# Patient Record
Sex: Female | Born: 1980 | Race: White | Hispanic: No | Marital: Married | Smoking: Former smoker
Health system: Southern US, Community
[De-identification: ages and names within clinical notes are randomized; demographics above are authoritative.]

## PROBLEM LIST (undated history)

## (undated) ENCOUNTER — Inpatient Hospital Stay (HOSPITAL_COMMUNITY): Payer: Self-pay

## (undated) DIAGNOSIS — R51 Headache: Secondary | ICD-10-CM

## (undated) DIAGNOSIS — O24419 Gestational diabetes mellitus in pregnancy, unspecified control: Secondary | ICD-10-CM

## (undated) DIAGNOSIS — R12 Heartburn: Secondary | ICD-10-CM

## (undated) DIAGNOSIS — O26899 Other specified pregnancy related conditions, unspecified trimester: Secondary | ICD-10-CM

## (undated) DIAGNOSIS — R87619 Unspecified abnormal cytological findings in specimens from cervix uteri: Secondary | ICD-10-CM

## (undated) DIAGNOSIS — J45909 Unspecified asthma, uncomplicated: Secondary | ICD-10-CM

## (undated) DIAGNOSIS — B977 Papillomavirus as the cause of diseases classified elsewhere: Secondary | ICD-10-CM

## (undated) DIAGNOSIS — R339 Retention of urine, unspecified: Secondary | ICD-10-CM

## (undated) DIAGNOSIS — O34599 Maternal care for other abnormalities of gravid uterus, unspecified trimester: Secondary | ICD-10-CM

## (undated) DIAGNOSIS — O021 Missed abortion: Secondary | ICD-10-CM

## (undated) DIAGNOSIS — Z8619 Personal history of other infectious and parasitic diseases: Secondary | ICD-10-CM

## (undated) DIAGNOSIS — IMO0002 Reserved for concepts with insufficient information to code with codable children: Secondary | ICD-10-CM

## (undated) DIAGNOSIS — Z87442 Personal history of urinary calculi: Secondary | ICD-10-CM

## (undated) HISTORY — DX: Personal history of other infectious and parasitic diseases: Z86.19

## (undated) HISTORY — PX: LITHOTRIPSY: SUR834

## (undated) HISTORY — PX: WISDOM TOOTH EXTRACTION: SHX21

## (undated) HISTORY — PX: CARPAL TUNNEL RELEASE: SHX101

## (undated) HISTORY — DX: Unspecified abnormal cytological findings in specimens from cervix uteri: R87.619

## (undated) HISTORY — DX: Maternal care for other abnormalities of gravid uterus, unspecified trimester: O34.599

## (undated) HISTORY — DX: Reserved for concepts with insufficient information to code with codable children: IMO0002

## (undated) HISTORY — DX: Papillomavirus as the cause of diseases classified elsewhere: B97.7

---

## 2001-04-16 ENCOUNTER — Ambulatory Visit (HOSPITAL_BASED_OUTPATIENT_CLINIC_OR_DEPARTMENT_OTHER): Admission: RE | Admit: 2001-04-16 | Discharge: 2001-04-16 | Payer: Self-pay | Admitting: Orthopedic Surgery

## 2002-07-07 ENCOUNTER — Emergency Department (HOSPITAL_COMMUNITY): Admission: EM | Admit: 2002-07-07 | Discharge: 2002-07-07 | Payer: Self-pay | Admitting: Emergency Medicine

## 2004-10-04 ENCOUNTER — Encounter: Admission: RE | Admit: 2004-10-04 | Discharge: 2004-10-04 | Payer: Self-pay | Admitting: Obstetrics and Gynecology

## 2005-08-10 ENCOUNTER — Inpatient Hospital Stay (HOSPITAL_COMMUNITY): Admission: AD | Admit: 2005-08-10 | Discharge: 2005-08-12 | Payer: Self-pay | Admitting: Obstetrics and Gynecology

## 2006-05-06 ENCOUNTER — Emergency Department (HOSPITAL_COMMUNITY): Admission: EM | Admit: 2006-05-06 | Discharge: 2006-05-06 | Payer: Self-pay | Admitting: Family Medicine

## 2007-05-26 ENCOUNTER — Inpatient Hospital Stay (HOSPITAL_COMMUNITY): Admission: AD | Admit: 2007-05-26 | Discharge: 2007-05-28 | Payer: Self-pay | Admitting: Obstetrics and Gynecology

## 2007-06-01 ENCOUNTER — Inpatient Hospital Stay (HOSPITAL_COMMUNITY): Admission: AD | Admit: 2007-06-01 | Discharge: 2007-06-01 | Payer: Self-pay | Admitting: Obstetrics & Gynecology

## 2009-07-27 ENCOUNTER — Ambulatory Visit (HOSPITAL_COMMUNITY): Admission: RE | Admit: 2009-07-27 | Discharge: 2009-07-27 | Payer: Self-pay | Admitting: Urology

## 2010-06-19 LAB — PREGNANCY, URINE: Preg Test, Ur: NEGATIVE

## 2010-08-14 NOTE — H&P (Signed)
[name], Tiffany Clark             ACCOUNT NO.:  [id number]   MEDICAL RECORD NO.:  [id number]          PATIENT TYPE:  INP   LOCATION:  9164                          FACILITY:  [facility]   PHYSICIAN:  Lenoard Aden, M.D.DATE OF BIRTH:  30-Jul-1980   DATE OF ADMISSION:  05/26/2007  DATE OF DISCHARGE:                              HISTORY & PHYSICAL   CHIEF COMPLAINT:  Labor.   HISTORY OF PRESENT ILLNESS:  She is a 30 year old white female, G2, P1  at 40-1/7 weeks' gestation with increased frequency of contractions.   SOCIAL HISTORY:  She is a nonsmoker, nondrinker. Denies drugs.   MEDICATIONS:  1. Prenatal vitamins.  2. Zoloft.   PREGNANCY COURSE:  complicated by anxiety and depression, currently on  Zoloft.   PHYSICAL EXAMINATION:  GENERAL:  She is a well developed, well nourished  white female, no acute distress.  HEENT:  Normal.  LUNGS:  Clear.  HEART:  Regular rate and rhythm.  ABDOMEN:  Soft.  Gravid. Nontender. Estimated fetal weight __________  pounds.Marland Kitchen  PELVIC:  Cervix is 5 cm, 100% vertex and -1 station.  EXTREMITIES:  No edema.  SKIN:  Intact.   IMPRESSION:  Term intrauterine pregnancy in active labor.   PLAN:  Attempt vaginal delivery with epidural as needed,      Lenoard Aden, M.D.  Electronically Signed     RJT/MEDQ  D:  05/27/2007  T:  05/27/2007  Job:  [id number]   cc:   ?

## 2010-08-17 NOTE — Op Note (Signed)
[place]  Patient:    Tiffany Clark, Tiffany Clark Visit Number: [id number] MRN: [id number]          Service Type: DSU Location: [facility] Attending Physician:  Ronne Binning Dictated by:   Nicki Reaper, M.D. Proc. Date: 04/16/01 Admit Date:  04/16/2001                             Operative Report  PREOPERATIVE DIAGNOSIS:  Carpal tunnel syndrome, right hand.  POSTOPERATIVE DIAGNOSIS:  Carpal tunnel syndrome, right hand.  OPERATION:  Decompression right median nerve.  SURGEON:  Nicki Reaper, M.D.  ASSISTANT:  Joaquin Courts, R.N.  ANESTHESIA:  Forearm-based IV regional.  DATE OF OPERATION:  April 16, 2001  ANESTHESIOLOGIST:  Bedelia Person, M.D.  HISTORY:  The patient is a 30 year old female with a history of carpal tunnel syndrome, EMG nerve conduction is positive to the right hand.  This has not responded to conservative treatment.  PROCEDURE:  The patient was brought to the operating room where a forearm-based IV regional anesthetic was carried out without difficulty.  She was prepped and draped using Betadine scrub and solution with the right arm free.  A longitudinal incision was made in the palm and carried down through the subcutaneous tissue.  Bleeders were electrocauterized.  The palmar fascia was split, the superficial palmar arch was identified, the flexor tendon tendon to the ring finger identified to the ulnar side of the median nerve. The carpal retinaculum was incised with sharp dissection.  A right angle and Sewall retractor were placed between the skin and forearm fascia, the fascia was released for approximately 3 cm proximal to the wrist crease under direct vision.  The canal was explored and no further lesions were identified.  The wound was irrigated.  The skin was closed with interrupted 5-0 nylon sutures. A sterile compressive dressing and splint were applied.  The patient tolerated the procedure well and was taken to the  recovery room for observation in satisfactory condition.  DISPOSITION:  She is discharged home to return to [facility] in one week on Vicodin and Keflex. Dictated by:   Nicki Reaper, M.D. Attending Physician:  Ronne Binning DD:  04/16/01 TD:  04/16/01 Job: [id number]

## 2010-08-17 NOTE — Op Note (Signed)
NAMECHARLYN, Tiffany Clark             ACCOUNT NO.:  [id number]   MEDICAL RECORD NO.:  [id number]          PATIENT TYPE:  INP   LOCATION:  9167                          [facility]ACILITY:  [facility]   PHYSICIAN:  Lenoard Aden, M.D.DATE OF BIRTH:  April 29, 1980   DATE OF PROCEDURE:  08/10/2005  DATE OF DISCHARGE:                                 OPERATIVE REPORT   INDICATION FOR OPERATIVE DELIVERY:  Fetal bradycardia and maternal  exhaustion.   BRIEF OPERATIVE NOTE:  After being apprised of the risks and benefits of  vacuum assistance including cephalohematoma, scalp laceration, and  intracranial hematoma, Kiwi cup was placed.  Fetal vertex, +4, OA less than  45degrees,two pulls, Apgars 8 and 9.  Cord blood collected.  Placenta  delivered spontaneously intact.  Three-vessel cord noted.  EBL 500cc.Bulb  suction done.  No evidence of lacerations.  The mother and baby to recovery  room in good condition.      Lenoard Aden, M.D.  Electronically Signed     RJT/MEDQ  D:  08/10/2005  T:  08/12/2005  Job:  [id number]

## 2010-12-21 LAB — CBC
HCT: 33.7 — ABNORMAL LOW
Hemoglobin: 11.1 — ABNORMAL LOW
Hemoglobin: 11.8 — ABNORMAL LOW
MCV: 86
RBC: 3.72 — ABNORMAL LOW
WBC: 12.3 — ABNORMAL HIGH

## 2010-12-24 LAB — URINALYSIS, ROUTINE W REFLEX MICROSCOPIC
Nitrite: NEGATIVE
Protein, ur: NEGATIVE
Urobilinogen, UA: 0.2

## 2010-12-24 LAB — CBC
Hemoglobin: 13.4
Platelets: 499 — ABNORMAL HIGH
RBC: 4.51
RDW: 14.3
WBC: 15.7 — ABNORMAL HIGH

## 2010-12-24 LAB — URINE MICROSCOPIC-ADD ON

## 2010-12-26 ENCOUNTER — Emergency Department (HOSPITAL_COMMUNITY)
Admission: EM | Admit: 2010-12-26 | Discharge: 2010-12-26 | Disposition: A | Discharge status: Home / Self Care | Payer: 59 | Attending: Emergency Medicine | Admitting: Emergency Medicine

## 2010-12-26 DIAGNOSIS — F3289 Other specified depressive episodes: Secondary | ICD-10-CM | POA: Insufficient documentation

## 2010-12-26 DIAGNOSIS — F329 Major depressive disorder, single episode, unspecified: Secondary | ICD-10-CM | POA: Insufficient documentation

## 2010-12-26 DIAGNOSIS — R1110 Vomiting, unspecified: Secondary | ICD-10-CM | POA: Insufficient documentation

## 2010-12-26 DIAGNOSIS — R109 Unspecified abdominal pain: Secondary | ICD-10-CM | POA: Insufficient documentation

## 2010-12-26 DIAGNOSIS — Z79899 Other long term (current) drug therapy: Secondary | ICD-10-CM | POA: Insufficient documentation

## 2010-12-26 DIAGNOSIS — N23 Unspecified renal colic: Secondary | ICD-10-CM | POA: Insufficient documentation

## 2010-12-26 LAB — URINALYSIS, ROUTINE W REFLEX MICROSCOPIC
Bilirubin Urine: NEGATIVE
Glucose, UA: NEGATIVE mg/dL
Ketones, ur: NEGATIVE mg/dL
Protein, ur: NEGATIVE mg/dL
Specific Gravity, Urine: 1.019 (ref 1.005–1.030)

## 2010-12-27 LAB — URINE CULTURE
Colony Count: NO GROWTH
Culture  Setup Time: 201209261350

## 2011-09-06 LAB — OB RESULTS CONSOLE ABO/RH

## 2011-09-06 LAB — OB RESULTS CONSOLE HEPATITIS B SURFACE ANTIGEN: Hepatitis B Surface Ag: NEGATIVE

## 2011-09-06 LAB — OB RESULTS CONSOLE ANTIBODY SCREEN: Antibody Screen: NEGATIVE

## 2011-09-06 LAB — OB RESULTS CONSOLE RUBELLA ANTIBODY, IGM: Rubella: IMMUNE

## 2011-10-03 ENCOUNTER — Inpatient Hospital Stay (HOSPITAL_COMMUNITY)
Admission: AD | Admit: 2011-10-03 | Discharge: 2011-10-03 | Disposition: A | Discharge status: Home / Self Care | Payer: 59 | Source: Ambulatory Visit | Attending: Obstetrics and Gynecology | Admitting: Obstetrics and Gynecology

## 2011-10-03 ENCOUNTER — Encounter (HOSPITAL_COMMUNITY): Payer: Self-pay

## 2011-10-03 DIAGNOSIS — R339 Retention of urine, unspecified: Secondary | ICD-10-CM | POA: Diagnosis present

## 2011-10-03 DIAGNOSIS — O99891 Other specified diseases and conditions complicating pregnancy: Secondary | ICD-10-CM | POA: Insufficient documentation

## 2011-10-03 DIAGNOSIS — N814 Uterovaginal prolapse, unspecified: Secondary | ICD-10-CM

## 2011-10-03 HISTORY — DX: Unspecified asthma, uncomplicated: J45.909

## 2011-10-03 HISTORY — DX: Retention of urine, unspecified: R33.9

## 2011-10-03 HISTORY — DX: Headache: R51

## 2011-10-03 LAB — URINALYSIS, ROUTINE W REFLEX MICROSCOPIC
Leukocytes, UA: NEGATIVE
Nitrite: NEGATIVE
pH: 6 (ref 5.0–8.0)

## 2011-10-03 NOTE — MAU Provider Note (Signed)
Tiffany Clark is a 31 y.o. female at [redacted]w[redacted]d presenting for inability to void since last night and unknown tissue noted at introitus. She has a hx sig for SVD x 2 and RPL x2 which she received oral progesterone until completed 12 wks.  Unable to void with initial presentation, she was cathed for urine sample this AM and residual volume of 800 cc. Reports difficulty urinating intermittent for past 2 weeks, needed to wait for up to 30 min at times. No relief with different positions / posture while attempting urination. She reports able to urinate w/o difficulty for past 2 days, but now retention returned. Has not had this problem in the past pregnancies.   History OB History    Grav Para Term Preterm Abortions TAB SAB Ect Mult Living   5 2 2       2      Past Medical History  Diagnosis Date  . Asthma   . Headache   . Urinary retention 10/03/2011   Past Surgical History  Procedure Date  . Wisdom tooth extraction   . Carpal tunnel release     r WRIST ONLY   Family History: family history is negative for Other. Social History:  reports that she has never smoked. She has never used smokeless tobacco. She reports that she does not drink alcohol or use illicit drugs.   Prenatal Transfer Tool  Maternal Diabetes: No Genetic Screening: NT sono pending tomorrow Maternal Ultrasounds/Referrals: N/A Fetal Ultrasounds or other Referrals:  None Maternal Substance Abuse:  No Significant Maternal Medications:  None Significant Maternal Lab Results:  Nl PNlabs Other Comments:  None  Review of Systems  All other systems reviewed and are negative.   No N/V, no back or flank pain, no fever, no VB / LOF No dysuria / frequency / urgency No constipation  Dilation: Closed Effacement (%): Thick Station: +1 at rest, posterior LUS visible at introitus w/ valsalva    Blood pressure 126/77, pulse 104, temperature 98.6 F (37 C), temperature source Oral, resp. rate 16, height 5\' 4"  (1.626 m), weight  74.39 kg (164 lb), last menstrual period 07/01/2011, SpO2 100.00%. Maternal Exam:  Abdomen: Fundal height is 1 FB above SP.    Introitus: Normal vulva. Vulva is negative for condylomata.  Normal vagina.  Cervix: Cervix evaluated by sterile speculum exam.   As noted above  Fetal Exam Fetal Monitor Review: Mode: hand-held doppler probe.   Baseline rate: 167.   Fetal State Assessment: Appropriate for gestational age  Physical Exam  Constitutional: She is oriented to person, place, and time. She appears well-developed. No distress.  HENT:  Head: Normocephalic.  Cardiovascular: Normal rate.   Respiratory: Breath sounds normal.  GI: Soft. She exhibits no distension. There is no tenderness.  Genitourinary: Vagina normal. Pelvic exam was performed with patient supine. Uterus is deviated (posterior LUS noted at introitus w/ bearing down effort, at +1+2 station when supine at rest.) and enlarged (c/w 13 wks size). Cervix exhibits no motion tenderness (cervix immediately anterior behind SP, sharp angle noted at urethral neck).  Musculoskeletal: Normal range of motion. She exhibits no edema.  Neurological: She is alert and oriented to person, place, and time. She has normal reflexes.  Skin: Skin is warm and dry.  Psychiatric: She has a normal mood and affect.     Results for orders placed during the hospital encounter of 10/03/11 (from the past 24 hour(s))  URINALYSIS, ROUTINE W REFLEX MICROSCOPIC     Status: Normal  Collection Time   10/03/11  8:15 AM      Component Value Range   Color, Urine YELLOW  YELLOW   APPearance CLEAR  CLEAR   Specific Gravity, Urine 1.015  1.005 - 1.030   pH 6.0  5.0 - 8.0   Glucose, UA NEGATIVE  NEGATIVE mg/dL   Hgb urine dipstick NEGATIVE  NEGATIVE   Bilirubin Urine NEGATIVE  NEGATIVE   Ketones, ur NEGATIVE  NEGATIVE mg/dL   Protein, ur NEGATIVE  NEGATIVE mg/dL   Urobilinogen, UA 0.2  0.0 - 1.0 mg/dL   Nitrite NEGATIVE  NEGATIVE   Leukocytes, UA NEGATIVE   NEGATIVE    Assessment/Plan: G5P2002  IUP at [redacted]w[redacted]d  Grossly nl UA, no evidence of UTI  Urinary retention likely d/t uterine prolapse  Patient able to void slowly after pelvic exam and with pelvic tilt while applying gentle pressure supapubic to deflect the uterus  Plan F/U in office tomorrow, likely will need to be fitted short term w/ pessary   Instruction to void q 1-2 hours while awake today. If unable to void > 12 hours, will need rpt cath  Plan discussed w/ Dr. Billy Coast, agrees  [facility] 10/03/2011, 9:47 AM

## 2011-10-03 NOTE — MAU Note (Signed)
Pt. Unable to void. In and out cath performed. 800 cc voided. Pt tolerated procedure well.Specimen sent to lab.

## 2011-10-03 NOTE — MAU Note (Signed)
Patient states she has a history of difficulty urinating, has not urinated since 2200 last night. Has some "fleshy pink tissue" coming out of the vagina.

## 2011-10-03 NOTE — MAU Provider Note (Signed)
Pt at 13 + wks with c/o tissue at vagina. + FHT's, Dr Billy Coast is coming to see her. Pt appears comfortable, stable, is aware of plan.

## 2011-10-03 NOTE — MAU Note (Signed)
Pt states had no difficulty with voiding x1 week. Last night fell asleep at 2200, awoke at 0500 this am and had intense urge to void, and was unable to. Noted pinkish white tissue at opening of her vagina, felt with fingers, states was large. Called MD office, talked to Dr. Billy Coast, and told to come to MAU for eval. Denies burning or urgency with voiding. States does have instances of incontinence, however very random. Denies bleeding or abnormal vaginal discharge.

## 2012-03-23 ENCOUNTER — Encounter (HOSPITAL_COMMUNITY): Payer: Self-pay | Admitting: *Deleted

## 2012-03-23 ENCOUNTER — Telehealth (HOSPITAL_COMMUNITY): Payer: Self-pay | Admitting: *Deleted

## 2012-03-23 NOTE — Telephone Encounter (Signed)
Preadmission screen  

## 2012-03-31 ENCOUNTER — Other Ambulatory Visit: Payer: Self-pay | Admitting: Obstetrics and Gynecology

## 2012-04-01 NOTE — L&D Delivery Note (Signed)
Operative Delivery Note At 3:59 PM a viable and healthy female was delivered via Vaginal, Spontaneous Delivery.  Presentation: vertex; Position: Left,, Occiput,, Anterior; Station: +5.  Delivery of the head: uncomplicated and spontaneous  ,   Second maneuver: ,  McRoberts Third maneuver: ,  Delivery of posterior arm Fourth maneuver: ,  na Fifth maneuver: ,  na Sixth maneuver: ,  na  Verbal consent: obtained from family.  APGAR: 9, 9; weight  Pending. Placenta status: Intact, Spontaneous.   Cord: 3 vessels with the following complications: None, true knot noted.  Cord pH: na  Anesthesia: Epidural  Episiotomy: None Lacerations: 2nd degree Suture Repair: 2.0 vicryl rapide Est. Blood Loss (mL): 300  Mom to postpartum.  Baby to nursery-stable.  Enis Riecke J 04/03/2012, 4:16 PM

## 2012-04-03 ENCOUNTER — Encounter (HOSPITAL_COMMUNITY): Payer: Self-pay | Admitting: Anesthesiology

## 2012-04-03 ENCOUNTER — Encounter (HOSPITAL_COMMUNITY): Payer: Self-pay

## 2012-04-03 ENCOUNTER — Inpatient Hospital Stay (HOSPITAL_COMMUNITY): Payer: 59 | Admitting: Anesthesiology

## 2012-04-03 ENCOUNTER — Inpatient Hospital Stay (HOSPITAL_COMMUNITY)
Admission: RE | Admit: 2012-04-03 | Discharge: 2012-04-04 | DRG: 775 | Disposition: A | Discharge status: Home / Self Care | Payer: 59 | Source: Ambulatory Visit | Attending: Obstetrics and Gynecology | Admitting: Obstetrics and Gynecology

## 2012-04-03 VITALS — BP 119/79 | HR 70 | Temp 97.7°F | Resp 18

## 2012-04-03 DIAGNOSIS — O34599 Maternal care for other abnormalities of gravid uterus, unspecified trimester: Secondary | ICD-10-CM | POA: Diagnosis present

## 2012-04-03 DIAGNOSIS — N814 Uterovaginal prolapse, unspecified: Secondary | ICD-10-CM | POA: Diagnosis present

## 2012-04-03 DIAGNOSIS — R339 Retention of urine, unspecified: Secondary | ICD-10-CM

## 2012-04-03 LAB — CBC
HCT: 32.7 % — ABNORMAL LOW (ref 36.0–46.0)
MCV: 85.4 fL (ref 78.0–100.0)
Platelets: 281 10*3/uL (ref 150–400)
RBC: 3.83 MIL/uL — ABNORMAL LOW (ref 3.87–5.11)
RDW: 13.6 % (ref 11.5–15.5)
WBC: 8.1 10*3/uL (ref 4.0–10.5)

## 2012-04-03 MED ORDER — SODIUM BICARBONATE 8.4 % IV SOLN
INTRAVENOUS | Status: DC | PRN
  Administered 2012-04-03: 5 mL via EPIDURAL

## 2012-04-03 MED ORDER — SIMETHICONE 80 MG PO CHEW: 80.0000 mg | CHEWABLE_TABLET | ORAL | Status: DC | PRN

## 2012-04-03 MED ORDER — OXYCODONE-ACETAMINOPHEN 5-325 MG PO TABS: 1.0000 | ORAL_TABLET | ORAL | Status: DC | PRN

## 2012-04-03 MED ORDER — PRENATAL MULTIVITAMIN CH
1.0000 | ORAL_TABLET | Freq: Every day | ORAL | Status: DC
  Administered 2012-04-03 – 2012-04-04 (×2): 1 via ORAL
  Filled 2012-04-03 (×2): qty 1

## 2012-04-03 MED ORDER — CITRIC ACID-SODIUM CITRATE 334-500 MG/5ML PO SOLN: 30.0000 mL | ORAL | Status: DC | PRN

## 2012-04-03 MED ORDER — FENTANYL 2.5 MCG/ML BUPIVACAINE 1/10 % EPIDURAL INFUSION (WH - ANES)
14.0000 mL/h | INTRAMUSCULAR | Status: DC
  Filled 2012-04-03: qty 125

## 2012-04-03 MED ORDER — IBUPROFEN 600 MG PO TABS
600.0000 mg | ORAL_TABLET | Freq: Four times a day (QID) | ORAL | Status: DC
  Administered 2012-04-03 – 2012-04-04 (×5): 600 mg via ORAL
  Filled 2012-04-03 (×5): qty 1

## 2012-04-03 MED ORDER — LACTATED RINGERS IV SOLN: 500.0000 mL | Freq: Once | INTRAVENOUS | Status: DC

## 2012-04-03 MED ORDER — ONDANSETRON HCL 4 MG PO TABS: 4.0000 mg | ORAL_TABLET | ORAL | Status: DC | PRN

## 2012-04-03 MED ORDER — ALBUTEROL SULFATE HFA 108 (90 BASE) MCG/ACT IN AERS: 2.0000 | INHALATION_SPRAY | Freq: Four times a day (QID) | RESPIRATORY_TRACT | Status: DC | PRN

## 2012-04-03 MED ORDER — BUTORPHANOL TARTRATE 1 MG/ML IJ SOLN
1.0000 mg | INTRAMUSCULAR | Status: DC | PRN
  Administered 2012-04-03: 1 mg via INTRAVENOUS
  Filled 2012-04-03: qty 1

## 2012-04-03 MED ORDER — OXYTOCIN BOLUS FROM INFUSION: 500.0000 mL | INTRAVENOUS | Status: DC

## 2012-04-03 MED ORDER — OXYTOCIN 40 UNITS IN LACTATED RINGERS INFUSION - SIMPLE MED
1.0000 m[IU]/min | INTRAVENOUS | Status: DC
  Administered 2012-04-03: 2 m[IU]/min via INTRAVENOUS
  Filled 2012-04-03: qty 1000

## 2012-04-03 MED ORDER — WITCH HAZEL-GLYCERIN EX PADS: 1.0000 "application " | MEDICATED_PAD | CUTANEOUS | Status: DC | PRN

## 2012-04-03 MED ORDER — LIDOCAINE HCL (PF) 1 % IJ SOLN: 30.0000 mL | INTRAMUSCULAR | Status: DC | PRN

## 2012-04-03 MED ORDER — LIDOCAINE HCL (PF) 1 % IJ SOLN
INTRAMUSCULAR | Status: DC | PRN
  Administered 2012-04-03 (×2): 4 mL

## 2012-04-03 MED ORDER — LACTATED RINGERS IV SOLN: 500.0000 mL | INTRAVENOUS | Status: DC | PRN

## 2012-04-03 MED ORDER — FLEET ENEMA 7-19 GM/118ML RE ENEM: 1.0000 | ENEMA | RECTAL | Status: DC | PRN

## 2012-04-03 MED ORDER — DIPHENHYDRAMINE HCL 25 MG PO CAPS: 25.0000 mg | ORAL_CAPSULE | Freq: Four times a day (QID) | ORAL | Status: DC | PRN

## 2012-04-03 MED ORDER — LACTATED RINGERS IV SOLN
INTRAVENOUS | Status: DC
  Administered 2012-04-03: 07:00:00 via INTRAVENOUS

## 2012-04-03 MED ORDER — DIBUCAINE 1 % RE OINT: 1.0000 "application " | TOPICAL_OINTMENT | RECTAL | Status: DC | PRN

## 2012-04-03 MED ORDER — METHYLERGONOVINE MALEATE 0.2 MG PO TABS: 0.2000 mg | ORAL_TABLET | ORAL | Status: DC | PRN

## 2012-04-03 MED ORDER — TERBUTALINE SULFATE 1 MG/ML IJ SOLN: 0.2500 mg | Freq: Once | INTRAMUSCULAR | Status: DC | PRN

## 2012-04-03 MED ORDER — FENTANYL 2.5 MCG/ML BUPIVACAINE 1/10 % EPIDURAL INFUSION (WH - ANES)
INTRAMUSCULAR | Status: DC | PRN
  Administered 2012-04-03: 14 mL/h via EPIDURAL

## 2012-04-03 MED ORDER — SENNOSIDES-DOCUSATE SODIUM 8.6-50 MG PO TABS
2.0000 | ORAL_TABLET | Freq: Every day | ORAL | Status: DC
  Administered 2012-04-04: 2 via ORAL

## 2012-04-03 MED ORDER — ACETAMINOPHEN 325 MG PO TABS: 650.0000 mg | ORAL_TABLET | ORAL | Status: DC | PRN

## 2012-04-03 MED ORDER — ONDANSETRON HCL 4 MG/2ML IJ SOLN: 4.0000 mg | Freq: Four times a day (QID) | INTRAMUSCULAR | Status: DC | PRN

## 2012-04-03 MED ORDER — EPHEDRINE 5 MG/ML INJ: 10.0000 mg | INTRAVENOUS | Status: DC | PRN

## 2012-04-03 MED ORDER — TETANUS-DIPHTH-ACELL PERTUSSIS 5-2.5-18.5 LF-MCG/0.5 IM SUSP: 0.5000 mL | Freq: Once | INTRAMUSCULAR | Status: DC

## 2012-04-03 MED ORDER — DIPHENHYDRAMINE HCL 50 MG/ML IJ SOLN: 12.5000 mg | INTRAMUSCULAR | Status: DC | PRN

## 2012-04-03 MED ORDER — PHENYLEPHRINE 40 MCG/ML (10ML) SYRINGE FOR IV PUSH (FOR BLOOD PRESSURE SUPPORT): 80.0000 ug | PREFILLED_SYRINGE | INTRAVENOUS | Status: DC | PRN

## 2012-04-03 MED ORDER — ONDANSETRON HCL 4 MG/2ML IJ SOLN: 4.0000 mg | INTRAMUSCULAR | Status: DC | PRN

## 2012-04-03 MED ORDER — EPHEDRINE 5 MG/ML INJ
10.0000 mg | INTRAVENOUS | Status: DC | PRN
  Filled 2012-04-03: qty 4

## 2012-04-03 MED ORDER — OXYTOCIN 40 UNITS IN LACTATED RINGERS INFUSION - SIMPLE MED: 62.5000 mL/h | INTRAVENOUS | Status: DC

## 2012-04-03 MED ORDER — ZOLPIDEM TARTRATE 5 MG PO TABS: 5.0000 mg | ORAL_TABLET | Freq: Every evening | ORAL | Status: DC | PRN

## 2012-04-03 MED ORDER — BENZOCAINE-MENTHOL 20-0.5 % EX AERO
1.0000 "application " | INHALATION_SPRAY | CUTANEOUS | Status: DC | PRN
  Filled 2012-04-03: qty 56

## 2012-04-03 MED ORDER — PHENYLEPHRINE 40 MCG/ML (10ML) SYRINGE FOR IV PUSH (FOR BLOOD PRESSURE SUPPORT)
80.0000 ug | PREFILLED_SYRINGE | INTRAVENOUS | Status: DC | PRN
  Filled 2012-04-03: qty 5

## 2012-04-03 MED ORDER — METHYLERGONOVINE MALEATE 0.2 MG/ML IJ SOLN: 0.2000 mg | INTRAMUSCULAR | Status: DC | PRN

## 2012-04-03 MED ORDER — IBUPROFEN 600 MG PO TABS: 600.0000 mg | ORAL_TABLET | Freq: Four times a day (QID) | ORAL | Status: DC | PRN

## 2012-04-03 MED ORDER — LANOLIN HYDROUS EX OINT: TOPICAL_OINTMENT | CUTANEOUS | Status: DC | PRN

## 2012-04-03 NOTE — H&P (Signed)
NAMECHANDY, Tiffany Clark               ACCOUNT NO.:  [id number]  MEDICAL RECORD NO.:  [id number]  LOCATION:  9160                          FACILITY:  [facility]  PHYSICIAN:  Lenoard Aden, M.D.DATE OF BIRTH:  [birth year]  DATE OF ADMISSION:  04/03/2012 DATE OF DISCHARGE:                             HISTORY & PHYSICAL   CHIEF COMPLAINT:  Induction at 39 weeks with __________.  HISTORY OF PRESENT ILLNESS:  The patient is a 32 year old white female, G5, P2, with history of symptomatic pelvic prolapse, twin pregnancy and pelvic pain in the second trimester requiring pessary.  She has had recurrent pain again in the third trimester, presents now with favorable cervix, at 39 weeks.  Due to recurrent pain, we will proceed with induction.  ALLERGIES:  She has allergies to LATEX.  SOCIAL HISTORY:  She is a nonsmoker, nondrinker.  She denies domestic or physical violence.  OBSTETRIC HISTORY:  Remarkable for two previous vaginal deliveries and two previous spontaneous abortions.  She has a current pregnancy complicated as noted above.  She has a personal history of asthma in childhood, headaches.  FAMILY HISTORY:  She has a family history which is noncontributory.  MEDICATIONS:  Additional medications include Tylenol with Codeine p.r.n. headaches and Tums.  She has had a 8 pound 2 ounce vaginal delivery and a previous 7 pounds 12 ounce vaginal delivery.  PHYSICAL EXAMINATION:  GENERAL:  She is a well-developed, well- nourished, white female, in no acute distress. HEENT:  Normal. NECK:  Supple.  Full range of motion. LUNGS:  Clear. HEART:  Regular rhythm. ABDOMEN:  Soft, gravid, nontender.  Estimated fetal weight 7-8 pounds. Cervix is 2-3 cm, 60%, vertex, -1. EXTREMITIES:  No cords. NEUROLOGIC:  Nonfocal. SKIN:  Intact.  IMPRESSION: 1. A 39-week intrauterine pregnancy. 2. History of symptomatic pelvic prolapse during pregnancy.  PLAN:  Proceed with induction, epidural as  needed.     Lenoard Aden, M.D.     RJT/MEDQ  D:  04/03/2012  T:  04/03/2012  Job:  [id number]

## 2012-04-03 NOTE — Anesthesia Procedure Notes (Signed)
Epidural Patient location during procedure: OB Start time: 04/03/2012 2:38 PM  Staffing Anesthesiologist: Earley Grobe A. Performed by: anesthesiologist   Preanesthetic Checklist Completed: patient identified, site marked, surgical consent, pre-op evaluation, timeout performed, IV checked, risks and benefits discussed and monitors and equipment checked  Epidural Patient position: sitting Prep: site prepped and draped and DuraPrep Patient monitoring: continuous pulse ox and blood pressure Approach: midline Injection technique: LOR air  Needle:  Needle type: Tuohy  Needle gauge: 17 G Needle length: 9 cm and 9 Needle insertion depth: 5 cm cm Catheter type: closed end flexible Catheter size: 19 Gauge Catheter at skin depth: 10 cm Test dose: negative and Other  Assessment Events: blood not aspirated, injection not painful, no injection resistance, negative IV test and no paresthesia  Additional Notes Patient identified. Risks and benefits discussed including failed block, incomplete  Pain control, post dural puncture headache, nerve damage, paralysis, blood pressure Changes, nausea, vomiting, reactions to medications-both toxic and allergic and post Partum back pain. All questions were answered. Patient expressed understanding and wished to proceed. Sterile technique was used throughout procedure. Epidural site was Dressed with sterile barrier dressing. No paresthesias, signs of intravascular injection Or signs of intrathecal spread were encountered.  Patient was more comfortable after the epidural was dosed. Please see RN's note for documentation of vital signs and FHR which are stable.

## 2012-04-03 NOTE — Progress Notes (Signed)
Tiffany Clark is a 32 y.o. [id number] at [redacted]w[redacted]d by LMP admitted for induction of labor due to pelvic pain and history of symptomatic prolapse in pregnancy.  Subjective: comfortable  Objective: LMP 07/01/2011 Vs pending      FHT:  FHR: 155 bpm, variability: moderate,  accelerations:  Present,  decelerations:  Absent UC:   none SVE:    2-3/70/-1  Labs: pending  Assessment / Plan: Induction of labor due to above,  progressing well on pitocin  Labor: Progressing normally Preeclampsia:  na Fetal Wellbeing:  Category I Pain Control:  Labor support without medications I/D:  n/a Anticipated MOD:  NSVD  Kamrie Fanton J 04/03/2012, 7:01 AM

## 2012-04-03 NOTE — Anesthesia Preprocedure Evaluation (Signed)

## 2012-04-03 NOTE — Progress Notes (Signed)
Tiffany Clark is a 32 y.o. [id number] at [redacted]w[redacted]d by LMP admitted for induction of labor due to pelvic pain.  Subjective: Uncomfortable  Objective: BP 130/64  Pulse 88  Temp 97.7 F (36.5 C) (Oral)  Resp 18  LMP 07/01/2011      FHT:  FHR: 155 bpm, variability: moderate,  accelerations:  Present,  decelerations:  Absent UC:   regular, every 5 minutes SVE:   Dilation: 4 Effacement (%): 60 Station: -1 Exam by:: lee IUPC placed  Labs: Lab Results  Component Value Date   WBC 8.1 04/03/2012   HGB 11.0* 04/03/2012   HCT 32.7* 04/03/2012   MCV 85.4 04/03/2012   PLT 281 04/03/2012    Assessment / Plan: Induction of labor due to noted indications,  progressing well on pitocin  Labor: Progressing normally Preeclampsia:  na Fetal Wellbeing:  Category I Pain Control:  Labor support without medications I/D:  n/a Anticipated MOD:  NSVD  Tiffany Clark J 04/03/2012, 1:44 PM

## 2012-04-04 LAB — CBC
Platelets: 254 10*3/uL (ref 150–400)
RDW: 13.9 % (ref 11.5–15.5)
WBC: 12.6 10*3/uL — ABNORMAL HIGH (ref 4.0–10.5)

## 2012-04-04 MED ORDER — OXYCODONE-ACETAMINOPHEN 5-325 MG PO TABS: 1.0000 | ORAL_TABLET | ORAL | Status: DC | PRN

## 2012-04-04 MED ORDER — IBUPROFEN 600 MG PO TABS: 600.0000 mg | ORAL_TABLET | Freq: Four times a day (QID) | ORAL | Status: DC

## 2012-04-04 NOTE — Anesthesia Postprocedure Evaluation (Signed)
Anesthesia Post Note  Patient: Tiffany Clark  Procedure(s) Performed: * No procedures listed *  Anesthesia type: Epidural  Patient location: Mother/Baby  Post pain: Pain level controlled  Post assessment: Post-op Vital signs reviewed  Last Vitals:  Filed Vitals:   04/04/12 0620  BP: 102/66  Pulse: 88  Temp: 36.7 C  Resp: 18    Post vital signs: Reviewed  Level of consciousness:alert  Complications: No apparent anesthesia complications

## 2012-04-04 NOTE — Progress Notes (Signed)
PPD 1 SVD  S:  Reports feeling well- wants discharge today             Tolerating po/ No nausea or vomiting             Bleeding is light             Pain controlled with motrin and percocet             Up ad lib / ambulatory  Newborn breast feeding  / Circumcision planned today   O:               VS: BP 121/72  Pulse 82  Temp 97.7 F (36.5 C) (Oral)  Resp 18  SpO2 99%  LMP 07/01/2011  Breastfeeding? Unknown   LABS:  Basename 04/04/12 0524 04/03/12 0645  WBC 12.6* 8.1  HGB 11.0* 11.0*  PLT 254 281                Physical Exam:             Alert and oriented X3  Lungs: Clear and unlabored  Heart: regular rate and rhythm / no mumurs  Abdomen: soft, non-tender, non-distended              Fundus: firm, non-tender, U-1  Perineum: mild edema  Lochia: light  Extremities: 1+ pedal edema, no calf pain or tenderness    A: PPD # 1   Doing well - stable status  P:  Routine post partum orders  Discharge home after newborn circ  Marlinda Mike CNM, MSN 04/04/2012, 11:38 AM

## 2012-04-04 NOTE — Discharge Summary (Signed)
Obstetric Discharge Summary Reason for Admission: induction of labor Prenatal Procedures: none Intrapartum Procedures: spontaneous vaginal delivery Postpartum Procedures: none Complications-Operative and Postpartum: 2nd degree perineal laceration and shoulder dystocia Hemoglobin  Date Value Range Status  04/04/2012 11.0* 12.0 - 15.0 g/dL Final     HCT  Date Value Range Status  04/04/2012 33.2* 36.0 - 46.0 % Final    Physical Exam:  General: alert, cooperative and no distress Lochia: appropriate Uterine Fundus: firm Incision: healing well DVT Evaluation: No evidence of DVT seen on physical exam.  Discharge Diagnoses: Term Pregnancy-delivered  Discharge Information: Date: 04/04/2012 Activity: pelvic rest Diet: routine Medications: PNV, Ibuprofen and Percocet Condition: stable Instructions: refer to practice specific booklet Discharge to: home Follow-up Information    Follow up with Lenoard Aden, MD. Schedule an appointment as soon as possible for a visit in 6 weeks.   Contact information:   Tiffany Clark [place] [address] [phone]          Newborn Data: Live born female  Birth Weight: 8 lb 8.4 oz (3867 g) APGAR: 9, 9  Home with mother.  Tiffany Clark 04/04/2012, 11:41 AM

## 2013-07-23 ENCOUNTER — Encounter (HOSPITAL_COMMUNITY): Payer: Self-pay | Admitting: *Deleted

## 2013-07-23 ENCOUNTER — Inpatient Hospital Stay (HOSPITAL_COMMUNITY)
Admission: AD | Admit: 2013-07-23 | Discharge: 2013-07-24 | Disposition: A | Discharge status: Home / Self Care | Payer: 59 | Source: Ambulatory Visit | Attending: Obstetrics and Gynecology | Admitting: Obstetrics and Gynecology

## 2013-07-23 DIAGNOSIS — O200 Threatened abortion: Secondary | ICD-10-CM | POA: Insufficient documentation

## 2013-07-23 NOTE — MAU Note (Signed)
Thursday afternoon had some light pink spotting. At 1600 today started with bright bleeding which has continued. Bleeding is not heavy but have had 2 prior SABs so is concerned. Mild cramping

## 2013-07-24 ENCOUNTER — Encounter (HOSPITAL_COMMUNITY): Payer: Self-pay | Admitting: *Deleted

## 2013-07-24 ENCOUNTER — Inpatient Hospital Stay (HOSPITAL_COMMUNITY): Payer: 59

## 2013-07-24 DIAGNOSIS — O200 Threatened abortion: Secondary | ICD-10-CM

## 2013-07-24 HISTORY — DX: Threatened abortion: O20.0

## 2013-07-24 NOTE — Discharge Instructions (Signed)
Threatened Miscarriage   A threatened miscarriage is a pregnancy that may end - you may experience cramping and/or bleeding. Often the bleeding is the same or slightly heavier than your normal menstrual period.  You should NOT:  Have sex (intercourse).  Have an orgasm.  Use any tampons.  Exercise until all bleeding stops.  Do any heavy physical activity or work. HOME CARE   You should rest at home.  Count the number of pads you use each day.   If you pass any tissue you may try to collect it in a container. Refrigerate the container until you can take the container to your doctor.   GET HELP RIGHT AWAY IF:   You have severe or pain in your belly (abdomen), lower belly, or back.  You have a fever or chills.  Your bleeding gets very heavy with you passing large clots of blood or tissue.  You feel lightheaded, weak, dizzy, or pass out (faint).   MAKE SURE YOU:   Understand these instructions.   CALL your doctor's office prior to coming to the hospital.   Document Released: 02/29/2008 Document Revised: 06/10/2011 Document Reviewed: 04/03/2009 [facility] Patient Information 2014 [employer], [place].

## 2013-07-24 NOTE — Progress Notes (Signed)
Wiliam Ke. Bailey CNM notified of pt's admission and status. Order for OB u/s given and will see pt

## 2013-07-24 NOTE — MAU Provider Note (Signed)
  History     CSN: [id number]  Arrival date and time: 07/23/13 2316 Call to provider @ 0022 Provider here to evaluate patient @ 0100 - patient still in ultrasound     Chief Complaint  Patient presents with  . Vaginal Bleeding   HPI  Onset light spotting since 1600 - intermittent brown to red color Constant mild cramping but no pain Hx recurrent SAB - prometrium 100mg  daily / sono in office @ 6 weeks with IUP last week  Past Medical History  Diagnosis Date  . Asthma   . Headache(784.0)   . Urinary retention 10/03/2011  . H/O varicella   . Abnormal Pap smear   . 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 Other abnormalities in shape or position of gravid uterus and of neighboring structures, antepartum     prolapse during pregnancy  . Chronic kidney disease     kidney stones    Past Surgical History  Procedure Laterality Date  . Wisdom tooth extraction    . Carpal tunnel release      r WRIST ONLY    Family History  Problem Relation Age of Onset  . Adopted: Yes  . Other Neg Hx     History  Substance Use Topics  . Smoking status: Never Smoker   . Smokeless tobacco: Never Used  . Alcohol Use: No    Allergies:  Allergies  Allergen Reactions  . Aleve [Naproxen Sodium]     Heart races and sweaty palms. Feels like panic attack  . Latex Rash    Prescriptions prior to admission  Medication Sig Dispense Refill  . acetaminophen (TYLENOL) 500 MG tablet Take 1,000 mg by mouth every 6 (six) hours as needed.      . Prenatal Vit-Fe Fumarate-FA (PRENATAL MULTIVITAMIN) TABS Take 1 tablet by mouth daily.      . progesterone (PROMETRIUM) 100 MG capsule Take 100 mg by mouth daily.      Marland Kitchen. albuterol (PROVENTIL HFA) 108 (90 BASE) MCG/ACT inhaler Inhale 2 puffs into the lungs every 6 (six) hours as needed.      Marland Kitchen. ibuprofen (ADVIL,MOTRIN) 600 MG tablet Take 1 tablet (600 mg total) by mouth every 6 (six) hours.  30 tablet  0  .  oxyCODONE-acetaminophen (PERCOCET/ROXICET) 5-325 MG per tablet Take 1 tablet by mouth every 4 (four) hours as needed (moderate - severe pain).  20 tablet  0    ROS  Spotting tonight Cramping  Physical Exam   Blood pressure 122/79, pulse 110, temperature 98.3 F (36.8 C), resp. rate 20, height 5\' 3"  (1.6 m), weight 78.2 kg (172 lb 6.4 oz), last menstrual period 06/03/2013, unknown if currently breastfeeding.  A Positive per records  Physical Exam  Alert and oriented / NAD Abdomen soft and non-tender Spec exam:       vaginal vault:scant brown blood      cervix: closed with no active bleeding - scant red color to blood beneath os  MAU Course  Procedures ultrasound: C/W impending SAB versus SCH with risk of MAB  IUP with singleton with FHR 79 + SCH Measurements 6 week CRL   Assessment and Plan   IUP at 7 weeks with threatened AB  1) expectant management  2) follow-up in office Monday for repeat sono - viability  Marlinda Mikeanya Jamesmichael Shadd CNM MSN [facility]oscopy[facility]ACNM 07/24/2013, 1:01 AM

## 2013-07-24 NOTE — Progress Notes (Signed)
Wiliam Ke. Bailey CNM in to do pelvic exam and discuss test results and d/c plan. Written and verbal d/c instructions given and understanding voiced.

## 2013-10-20 LAB — OB RESULTS CONSOLE RPR: RPR: NONREACTIVE

## 2013-10-20 LAB — OB RESULTS CONSOLE ABO/RH: RH Type: POSITIVE

## 2013-10-20 LAB — OB RESULTS CONSOLE ANTIBODY SCREEN: ANTIBODY SCREEN: NEGATIVE

## 2013-10-20 LAB — OB RESULTS CONSOLE RUBELLA ANTIBODY, IGM: Rubella: IMMUNE

## 2013-10-20 LAB — OB RESULTS CONSOLE HIV ANTIBODY (ROUTINE TESTING): HIV: NONREACTIVE

## 2013-10-20 LAB — OB RESULTS CONSOLE HEPATITIS B SURFACE ANTIGEN: Hepatitis B Surface Ag: NEGATIVE

## 2014-01-31 ENCOUNTER — Encounter (HOSPITAL_COMMUNITY): Payer: Self-pay | Admitting: *Deleted

## 2014-03-05 ENCOUNTER — Telehealth: Payer: Self-pay

## 2014-03-05 NOTE — Telephone Encounter (Signed)
Patient call stating that fetus is not moving as much.  Reports kick counts is 11+ in one hour.  Reassurances given.  Patient also reporting vaginal pressure and states some issues yesterday with urination that has since cleared up.  Patient also reports cramping that was intermittent and lasted about 30 minutes yesterday, but no problems since.  Patient denies issues with vaginal discharge, constipation, and diarrhea.  Patient denies sexual intercourse in last 48 hours.  Additional reassurances given.  Patient educated on possible causes of vaginal pressure including fetal engagement, UTI, bacterial and/or yeast infection.  Patient verbalized understanding and was encouraged to report to MAU, for evaluation, if symptoms became intolerable over the weekend.  Otherwise patient advised to call office on Monday for same day problem visit if symptoms still present.  No other questions or concerns.  Patient encouraged to call back if any arise. JE, CNm

## 2014-04-01 DIAGNOSIS — O24419 Gestational diabetes mellitus in pregnancy, unspecified control: Secondary | ICD-10-CM

## 2014-04-01 HISTORY — DX: Gestational diabetes mellitus in pregnancy, unspecified control: O24.419

## 2014-04-14 ENCOUNTER — Inpatient Hospital Stay (HOSPITAL_COMMUNITY)
Admission: AD | Admit: 2014-04-14 | Discharge: 2014-04-14 | Disposition: A | Discharge status: Home / Self Care | Payer: 59 | Source: Ambulatory Visit | Attending: Obstetrics & Gynecology | Admitting: Obstetrics & Gynecology

## 2014-04-14 ENCOUNTER — Encounter (HOSPITAL_COMMUNITY): Payer: Self-pay | Admitting: *Deleted

## 2014-04-14 ENCOUNTER — Inpatient Hospital Stay (HOSPITAL_COMMUNITY): Payer: 59

## 2014-04-14 DIAGNOSIS — O24420 Gestational diabetes mellitus in childbirth, diet controlled: Secondary | ICD-10-CM | POA: Insufficient documentation

## 2014-04-14 DIAGNOSIS — O24410 Gestational diabetes mellitus in pregnancy, diet controlled: Secondary | ICD-10-CM | POA: Insufficient documentation

## 2014-04-14 DIAGNOSIS — O24419 Gestational diabetes mellitus in pregnancy, unspecified control: Secondary | ICD-10-CM

## 2014-04-14 DIAGNOSIS — Z3A37 37 weeks gestation of pregnancy: Secondary | ICD-10-CM | POA: Insufficient documentation

## 2014-04-14 DIAGNOSIS — Z87442 Personal history of urinary calculi: Secondary | ICD-10-CM | POA: Insufficient documentation

## 2014-04-14 DIAGNOSIS — O289 Unspecified abnormal findings on antenatal screening of mother: Secondary | ICD-10-CM

## 2014-04-14 DIAGNOSIS — O288 Other abnormal findings on antenatal screening of mother: Secondary | ICD-10-CM

## 2014-04-14 HISTORY — DX: Gestational diabetes mellitus in pregnancy, unspecified control: O24.419

## 2014-04-14 NOTE — MAU Provider Note (Signed)
History     CSN: [id number]  Arrival date and time: 04/14/14 1712   First Provider Initiated Contact with Patient 04/14/14 1747      Chief Complaint  Patient presents with  . non-reactive fetal tracing    HPI  Tiffany Clark is a a. 34 y.o. [id number] at [redacted]w[redacted]d who presents today from the office. She states that she had a non-reactive NST, and was told to come for further monitoring. She states that she has diet controlled GDM. She denies any abdominal pain, contractions, VB or LOF. She states that the fetus has been active.   Past Medical History  Diagnosis Date  . Asthma   . Headache(784.0)   . Urinary retention 10/03/2011  . H/O varicella   . Abnormal Pap smear   . 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 Other abnormalities in shape or position of gravid uterus and of neighboring structures, antepartum     prolapse during pregnancy  . Chronic kidney disease     kidney stones    Past Surgical History  Procedure Laterality Date  . Wisdom tooth extraction    . Carpal tunnel release      r WRIST ONLY    Family History  Problem Relation Age of Onset  . Adopted: Yes  . Other Neg Hx     History  Substance Use Topics  . Smoking status: Never Smoker   . Smokeless tobacco: Never Used  . Alcohol Use: No    Allergies:  Allergies  Allergen Reactions  . Aleve [Naproxen Sodium]     Heart races and sweaty palms. Feels like panic attack  . Latex Rash    Prescriptions prior to admission  Medication Sig Dispense Refill Last Dose  . acetaminophen (TYLENOL) 500 MG tablet Take 1,000 mg by mouth every 6 (six) hours as needed for mild pain or headache.    prn  . albuterol (PROVENTIL HFA) 108 (90 BASE) MCG/ACT inhaler Inhale 2 puffs into the lungs every 6 (six) hours as needed for wheezing or shortness of breath.    prn  . calcium carbonate (TUMS - DOSED IN MG ELEMENTAL CALCIUM) 500 MG chewable tablet Chew 2 tablets by mouth 3 (three)  times daily as needed for indigestion or heartburn.     . Prenatal Vit-Fe Fumarate-FA (PRENATAL MULTIVITAMIN) TABS Take 1 tablet by mouth daily.   Past Week at Unknown time  . ranitidine (ZANTAC) 75 MG tablet Take 75 mg by mouth 2 (two) times daily.       ROS Physical Exam   Blood pressure 123/77, pulse 117, temperature 99.4 F (37.4 C), temperature source Oral, resp. rate 18, last menstrual period 06/03/2013, unknown if currently breastfeeding.  Physical Exam  Nursing note and vitals reviewed. Constitutional: She is oriented to person, place, and time. She appears well-developed and well-nourished. No distress.  Cardiovascular: Normal rate.   Respiratory: Effort normal.  GI: Soft. There is no tenderness. There is no rebound.  Neurological: She is alert and oriented to person, place, and time.  Skin: Skin is warm and dry.  Psychiatric: She has a normal mood and affect.   FHT: 140, moderate with 15x15 accels, no decels Toco: q 6-2 mins   MAU Course  Procedures   BPP: 8/8 1947: D/W Dr. Charlotta Newton, [place] for dc home   Assessment and Plan   1. Non-reactive NST (non-stress test)   2. Gestational diabetes    NST reactive here in MAU BPP  8/8  Discharge home Labor precautions Fetal kick counts  Return to MAU as needed  Follow-up Information    Follow up with Myna HidalgoZAN, JENNIFER, M, DO.   Specialty:  Obstetrics and Gynecology   Why:  As scheduled   Contact information:   [address] [place] [id number] [phone]        Tawnya CrookHogan, Heather Donovan 04/14/2014, 5:51 PM

## 2014-04-14 NOTE — Progress Notes (Signed)
Dr. Charlotta Newtonzan called to check on pt., is waiting for BPP.  MD states she has reviewed strip, reactive.

## 2014-04-14 NOTE — Discharge Instructions (Signed)
Third Trimester of Pregnancy The third trimester is from week 29 through week 42, months 7 through 9. The third trimester is a time when the fetus is growing rapidly. At the end of the ninth month, the fetus is about 20 inches in length and weighs 6-10 pounds.  BODY CHANGES Your body goes through many changes during pregnancy. The changes vary from woman to woman.   Your weight will continue to increase. You can expect to gain 25-35 pounds (11-16 kg) by the end of the pregnancy.  You may begin to get stretch marks on your hips, abdomen, and breasts.  You may urinate more often because the fetus is moving lower into your pelvis and pressing on your bladder.  You may develop or continue to have heartburn as a result of your pregnancy.  You may develop constipation because certain hormones are causing the muscles that push waste through your intestines to slow down.  You may develop hemorrhoids or swollen, bulging veins (varicose veins).  You may have pelvic pain because of the weight gain and pregnancy hormones relaxing your joints between the bones in your pelvis. Backaches may result from overexertion of the muscles supporting your posture.  You may have changes in your hair. These can include thickening of your hair, rapid growth, and changes in texture. Some women also have hair loss during or after pregnancy, or hair that feels dry or thin. Your hair will most likely return to normal after your baby is born.  Your breasts will continue to grow and be tender. A yellow discharge may leak from your breasts called colostrum.  Your belly button may stick out.  You may feel short of breath because of your expanding uterus.  You may notice the fetus "dropping," or moving lower in your abdomen.  You may have a bloody mucus discharge. This usually occurs a few days to a week before labor begins.  Your cervix becomes thin and soft (effaced) near your due date. WHAT TO EXPECT AT YOUR PRENATAL  EXAMS  You will have prenatal exams every 2 weeks until week 36. Then, you will have weekly prenatal exams. During a routine prenatal visit:  You will be weighed to make sure you and the fetus are growing normally.  Your blood pressure is taken.  Your abdomen will be measured to track your baby's growth.  The fetal heartbeat will be listened to.  Any test results from the previous visit will be discussed.  You may have a cervical check near your due date to see if you have effaced. At around 36 weeks, your caregiver will check your cervix. At the same time, your caregiver will also perform a test on the secretions of the vaginal tissue. This test is to determine if a type of bacteria, Group B streptococcus, is present. Your caregiver will explain this further. Your caregiver may ask you:  What your birth plan is.  How you are feeling.  If you are feeling the baby move.  If you have had any abnormal symptoms, such as leaking fluid, bleeding, severe headaches, or abdominal cramping.  If you have any questions. Other tests or screenings that may be performed during your third trimester include:  Blood tests that check for low iron levels (anemia).  Fetal testing to check the health, activity level, and growth of the fetus. Testing is done if you have certain medical conditions or if there are problems during the pregnancy. FALSE LABOR You may feel small, irregular contractions that   eventually go away. These are called Braxton Hicks contractions, or false labor. Contractions may last for hours, days, or even weeks before true labor sets in. If contractions come at regular intervals, intensify, or become painful, it is best to be seen by your caregiver.  SIGNS OF LABOR   Menstrual-like cramps.  Contractions that are 5 minutes apart or less.  Contractions that start on the top of the uterus and spread down to the lower abdomen and back.  A sense of increased pelvic pressure or back  pain.  A watery or bloody mucus discharge that comes from the vagina. If you have any of these signs before the 37th week of pregnancy, call your caregiver right away. You need to go to the hospital to get checked immediately. HOME CARE INSTRUCTIONS   Avoid all smoking, herbs, alcohol, and unprescribed drugs. These chemicals affect the formation and growth of the baby.  Follow your caregiver's instructions regarding medicine use. There are medicines that are either safe or unsafe to take during pregnancy.  Exercise only as directed by your caregiver. Experiencing uterine cramps is a good sign to stop exercising.  Continue to eat regular, healthy meals.  Wear a good support bra for breast tenderness.  Do not use hot tubs, steam rooms, or saunas.  Wear your seat belt at all times when driving.  Avoid raw meat, uncooked cheese, cat litter boxes, and soil used by cats. These carry germs that can cause birth defects in the baby.  Take your prenatal vitamins.  Try taking a stool softener (if your caregiver approves) if you develop constipation. Eat more high-fiber foods, such as fresh vegetables or fruit and whole grains. Drink plenty of fluids to keep your urine clear or pale yellow.  Take warm sitz baths to soothe any pain or discomfort caused by hemorrhoids. Use hemorrhoid cream if your caregiver approves.  If you develop varicose veins, wear support hose. Elevate your feet for 15 minutes, 3-4 times a day. Limit salt in your diet.  Avoid heavy lifting, wear low heal shoes, and practice good posture.  Rest a lot with your legs elevated if you have leg cramps or low back pain.  Visit your dentist if you have not gone during your pregnancy. Use a soft toothbrush to brush your teeth and be gentle when you floss.  A sexual relationship may be continued unless your caregiver directs you otherwise.  Do not travel far distances unless it is absolutely necessary and only with the approval  of your caregiver.  Take prenatal classes to understand, practice, and ask questions about the labor and delivery.  Make a trial run to the hospital.  Pack your hospital bag.  Prepare the baby's nursery.  Continue to go to all your prenatal visits as directed by your caregiver. SEEK MEDICAL CARE IF:  You are unsure if you are in labor or if your water has broken.  You have dizziness.  You have mild pelvic cramps, pelvic pressure, or nagging pain in your abdominal area.  You have persistent nausea, vomiting, or diarrhea.  You have a bad smelling vaginal discharge.  You have pain with urination. SEEK IMMEDIATE MEDICAL CARE IF:   You have a fever.  You are leaking fluid from your vagina.  You have spotting or bleeding from your vagina.  You have severe abdominal cramping or pain.  You have rapid weight loss or gain.  You have shortness of breath with chest pain.  You notice sudden or extreme swelling   of your face, hands, ankles, feet, or legs.  You have not felt your baby move in over an hour.  You have severe headaches that do not go away with medicine.  You have vision changes. Document Released: 03/12/2001 Document Revised: 03/23/2013 Document Reviewed: 05/19/2012 [employer] Patient Information 2015 [employer]. This information is not intended to replace advice given to you by your health care provider. Make sure you discuss any questions you have with your health care provider.  

## 2014-04-14 NOTE — MAU Note (Signed)
Gest diabetic, non-reactive today with variables.

## 2014-04-15 DIAGNOSIS — O24420 Gestational diabetes mellitus in childbirth, diet controlled: Secondary | ICD-10-CM | POA: Insufficient documentation

## 2014-04-15 DIAGNOSIS — O288 Other abnormal findings on antenatal screening of mother: Secondary | ICD-10-CM | POA: Insufficient documentation

## 2014-04-15 DIAGNOSIS — O24419 Gestational diabetes mellitus in pregnancy, unspecified control: Secondary | ICD-10-CM | POA: Insufficient documentation

## 2014-04-20 ENCOUNTER — Encounter (HOSPITAL_COMMUNITY): Payer: Self-pay | Admitting: General Practice

## 2014-04-20 ENCOUNTER — Inpatient Hospital Stay (HOSPITAL_COMMUNITY)
Admission: AD | Admit: 2014-04-20 | Discharge: 2014-04-20 | Disposition: A | Discharge status: Home / Self Care | Payer: 59 | Source: Ambulatory Visit | Attending: Obstetrics & Gynecology | Admitting: Obstetrics & Gynecology

## 2014-04-20 DIAGNOSIS — O9989 Other specified diseases and conditions complicating pregnancy, childbirth and the puerperium: Secondary | ICD-10-CM | POA: Insufficient documentation

## 2014-04-20 DIAGNOSIS — R109 Unspecified abdominal pain: Secondary | ICD-10-CM | POA: Insufficient documentation

## 2014-04-20 DIAGNOSIS — N949 Unspecified condition associated with female genital organs and menstrual cycle: Secondary | ICD-10-CM | POA: Insufficient documentation

## 2014-04-20 DIAGNOSIS — Z3A38 38 weeks gestation of pregnancy: Secondary | ICD-10-CM | POA: Insufficient documentation

## 2014-04-20 LAB — URINE MICROSCOPIC-ADD ON

## 2014-04-20 LAB — URINALYSIS, ROUTINE W REFLEX MICROSCOPIC
Bilirubin Urine: NEGATIVE
GLUCOSE, UA: NEGATIVE mg/dL
KETONES UR: NEGATIVE mg/dL
LEUKOCYTES UA: NEGATIVE
Nitrite: NEGATIVE
PROTEIN: NEGATIVE mg/dL
Specific Gravity, Urine: <1.005 — ABNORMAL LOW (ref 1.005–1.030)
UROBILINOGEN UA: 0.2 mg/dL (ref 0.0–1.0)
pH: 6.5 (ref 5.0–8.0)

## 2014-04-20 LAB — CBC WITH DIFFERENTIAL/PLATELET
Basophils Absolute: 0 10*3/uL (ref 0.0–0.1)
Basophils Relative: 0 % (ref 0–1)
EOS PCT: 1 % (ref 0–5)
Eosinophils Absolute: 0.1 10*3/uL (ref 0.0–0.7)
HEMATOCRIT: 33.9 % — AB (ref 36.0–46.0)
HEMOGLOBIN: 11.4 g/dL — AB (ref 12.0–15.0)
LYMPHS ABS: 2 10*3/uL (ref 0.7–4.0)
LYMPHS PCT: 19 % (ref 12–46)
MCH: 29.4 pg (ref 26.0–34.0)
MCHC: 33.6 g/dL (ref 30.0–36.0)
MCV: 87.4 fL (ref 78.0–100.0)
MONOS PCT: 6 % (ref 3–12)
Monocytes Absolute: 0.6 10*3/uL (ref 0.1–1.0)
Neutro Abs: 7.7 10*3/uL (ref 1.7–7.7)
Neutrophils Relative %: 74 % (ref 43–77)
PLATELETS: 267 10*3/uL (ref 150–400)
RBC: 3.88 MIL/uL (ref 3.87–5.11)
RDW: 13.3 % (ref 11.5–15.5)
WBC: 10.4 10*3/uL (ref 4.0–10.5)

## 2014-04-20 LAB — PROTIME-INR
INR: 0.95 (ref 0.00–1.49)
PROTHROMBIN TIME: 12.8 s (ref 11.6–15.2)

## 2014-04-20 LAB — TYPE AND SCREEN
ABO/RH(D): A POS
ANTIBODY SCREEN: NEGATIVE

## 2014-04-20 LAB — APTT: APTT: 24 s (ref 24–37)

## 2014-04-20 LAB — ABO/RH: ABO/RH(D): A POS

## 2014-04-20 LAB — FIBRINOGEN: Fibrinogen: 544 mg/dL — ABNORMAL HIGH (ref 204–475)

## 2014-04-20 MED ORDER — OXYCODONE-ACETAMINOPHEN 5-325 MG PO TABS: 1.0000 | ORAL_TABLET | ORAL | Status: DC

## 2014-04-20 MED ORDER — OXYCODONE-ACETAMINOPHEN 5-325 MG PO TABS
1.0000 | ORAL_TABLET | ORAL | Status: DC
  Filled 2014-04-20: qty 1

## 2014-04-20 MED ORDER — NITROFURANTOIN MONOHYD MACRO 100 MG PO CAPS: 100.0000 mg | ORAL_CAPSULE | Freq: Two times a day (BID) | ORAL | Status: AC

## 2014-04-20 NOTE — Discharge Instructions (Signed)
Hematuria Hematuria is blood in your urine. It can be caused by a bladder infection, kidney infection, prostate infection, kidney stone, or cancer of your urinary tract. Infections can usually be treated with medicine, and a kidney stone usually will pass through your urine. If neither of these is the cause of your hematuria, further workup to find out the reason may be needed. It is very important that you tell your health care provider about any blood you see in your urine, even if the blood stops without treatment or happens without causing pain. Blood in your urine that happens and then stops and then happens again can be a symptom of a very serious condition. Also, pain is not a symptom in the initial stages of many urinary cancers. HOME CARE INSTRUCTIONS   Drink lots of fluid, 3-4 quarts a day. If you have been diagnosed with an infection, cranberry juice is especially recommended, in addition to large amounts of water.  Avoid caffeine, tea, and carbonated beverages because they tend to irritate the bladder.  Avoid alcohol because it may irritate the prostate.  Take all medicines as directed by your health care provider.  If you were prescribed an antibiotic medicine, finish it all even if you start to feel better.  If you have been diagnosed with a kidney stone, follow your health care provider's instructions regarding straining your urine to catch the stone.  Empty your bladder often. Avoid holding urine for long periods of time.  After a bowel movement, women should cleanse front to back. Use each tissue only once.  Empty your bladder before and after sexual intercourse if you are a female. SEEK MEDICAL CARE IF:  You develop back pain.  You have a fever.  You have a feeling of sickness in your stomach (nausea) or vomiting.  Your symptoms are not better in 3 days. Return sooner if you are getting worse. SEEK IMMEDIATE MEDICAL CARE IF:   You develop severe vomiting and are  unable to keep the medicine down.  You develop severe back or abdominal pain despite taking your medicines.  You begin passing a large amount of blood or clots in your urine.  You feel extremely weak or faint, or you pass out. MAKE SURE YOU:   Understand these instructions.  Will watch your condition.  Will get help right away if you are not doing well or get worse. Document Released: 03/18/2005 Document Revised: 08/02/2013 Document Reviewed: 11/16/2012 [facility] Patient Information 2015 [place], [place]. This information is not intended to replace advice given to you by your health care provider. Make sure you discuss any questions you have with your health care provider.   Braxton Hicks Contractions Contractions of the uterus can occur throughout pregnancy. Contractions are not always a sign that you are in labor.  WHAT ARE BRAXTON HICKS CONTRACTIONS?  Contractions that occur before labor are called Braxton Hicks contractions, or false labor. Toward the end of pregnancy (32-34 weeks), these contractions can develop more often and may become more forceful. This is not true labor because these contractions do not result in opening (dilatation) and thinning of the cervix. They are sometimes difficult to tell apart from true labor because these contractions can be forceful and people have different pain tolerances. You should not feel embarrassed if you go to the hospital with false labor. Sometimes, the only way to tell if you are in true labor is for your health care provider to look for changes in the cervix. If there are  no prenatal problems or other health problems associated with the pregnancy, it is completely safe to be sent home with false labor and await the onset of true labor. HOW CAN YOU TELL THE DIFFERENCE BETWEEN TRUE AND FALSE LABOR? False Labor  The contractions of false labor are usually shorter and not as hard as those of true labor.   The contractions are usually  irregular.   The contractions are often felt in the front of the lower abdomen and in the groin.   The contractions may go away when you walk around or change positions while lying down.   The contractions get weaker and are shorter lasting as time goes on.   The contractions do not usually become progressively stronger, regular, and closer together as with true labor.  True Labor  Contractions in true labor last 30-70 seconds, become very regular, usually become more intense, and increase in frequency.   The contractions do not go away with walking.   The discomfort is usually felt in the top of the uterus and spreads to the lower abdomen and low back.   True labor can be determined by your health care provider with an exam. This will show that the cervix is dilating and getting thinner.  WHAT TO REMEMBER  Keep up with your usual exercises and follow other instructions given by your health care provider.   Take medicines as directed by your health care provider.   Keep your regular prenatal appointments.   Eat and drink lightly if you think you are going into labor.   If Braxton Hicks contractions are making you uncomfortable:   Change your position from lying down or resting to walking, or from walking to resting.   Sit and rest in a tub of warm water.   Drink 2-3 glasses of water. Dehydration may cause these contractions.   Do slow and deep breathing several times an hour.  WHEN SHOULD I SEEK IMMEDIATE MEDICAL CARE? Seek immediate medical care if:  Your contractions become stronger, more regular, and closer together.   You have fluid leaking or gushing from your vagina.   You have a fever.   You pass blood-tinged mucus.   You have vaginal bleeding.   You have continuous abdominal pain.   You have low back pain that you never had before.   You feel your baby's head pushing down and causing pelvic pressure.   Your baby is not moving  as much as it used to.  Document Released: 03/18/2005 Document Revised: 03/23/2013 Document Reviewed: 12/28/2012 [facility] Patient Information 2015 [place]. This information is not intended to replace advice given to you by your health care provider. Make sure you discuss any questions you have with your health care provider.

## 2014-04-20 NOTE — MAU Note (Signed)
Pt sent from MD office with constant lower abd/pelvic pain, denies bleeding or LOF.  Has noted some blood in urine, reactive NST in office, cervix closed.

## 2014-04-20 NOTE — MAU Note (Signed)
Urine in lab 

## 2014-04-26 ENCOUNTER — Encounter (HOSPITAL_COMMUNITY): Payer: Self-pay

## 2014-04-26 NOTE — Patient Instructions (Addendum)
   Your procedure is scheduled on:  Friday, Jan 29  [facility] at: 11:30 AM Pick up the phone at the desk and dial [phone] and inform us of your arrival.  Please call this number if you have any problems the morning of surgery: [phone]  Remember: Do not eat food after midnight: Thursday Do not drink clear liquids after: 9 AM Friday, day of surgery Take these medicines the morning of surgery with a SIP OF WATER:  Bring albuterol inhaler with you on day of surgery.  Do not wear jewelry, make-up, or FINGER nail polish No metal in your hair or on your body. Do not wear lotions, powders, perfumes.  You may wear deodorant.  Do not bring valuables to the hospital. Contacts, dentures or bridgework may not be worn into surgery.  Leave suitcase in the car. After Surgery it may be brought to your room. For patients being admitted to the hospital, checkout time is 11:00am the day of discharge.  Home with husband Thereasa DistanceRodney cell [phone]

## 2014-04-27 ENCOUNTER — Encounter (HOSPITAL_COMMUNITY)
Admission: RE | Admit: 2014-04-27 | Discharge: 2014-04-27 | Disposition: A | Discharge status: Home / Self Care | Payer: 59 | Source: Ambulatory Visit | Attending: Obstetrics & Gynecology | Admitting: Obstetrics & Gynecology

## 2014-04-27 ENCOUNTER — Encounter (HOSPITAL_COMMUNITY): Payer: Self-pay

## 2014-04-27 HISTORY — DX: Personal history of urinary calculi: Z87.442

## 2014-04-27 HISTORY — DX: Heartburn: R12

## 2014-04-27 HISTORY — DX: Missed abortion: O02.1

## 2014-04-27 HISTORY — DX: Other specified pregnancy related conditions, unspecified trimester: O26.899

## 2014-04-27 LAB — TYPE AND SCREEN
ABO/RH(D): A POS
Antibody Screen: NEGATIVE

## 2014-04-27 LAB — CBC
HCT: 33.7 % — ABNORMAL LOW (ref 36.0–46.0)
Hemoglobin: 11.3 g/dL — ABNORMAL LOW (ref 12.0–15.0)
MCH: 29.1 pg (ref 26.0–34.0)
MCHC: 33.5 g/dL (ref 30.0–36.0)
MCV: 86.9 fL (ref 78.0–100.0)
Platelets: 271 10*3/uL (ref 150–400)
RBC: 3.88 MIL/uL (ref 3.87–5.11)
RDW: 13.5 % (ref 11.5–15.5)
WBC: 8.8 10*3/uL (ref 4.0–10.5)

## 2014-04-27 LAB — BASIC METABOLIC PANEL WITH GFR
Anion gap: 5 (ref 5–15)
BUN: 10 mg/dL (ref 6–23)
CO2: 22 mmol/L (ref 19–32)
Calcium: 8.9 mg/dL (ref 8.4–10.5)
Chloride: 108 mmol/L (ref 96–112)
Creatinine, Ser: 0.54 mg/dL (ref 0.50–1.10)
GFR calc Af Amer: >90 mL/min
GFR calc non Af Amer: >90 mL/min
Glucose, Bld: 107 mg/dL — ABNORMAL HIGH (ref 70–99)
Potassium: 3.8 mmol/L (ref 3.5–5.1)
Sodium: 135 mmol/L (ref 135–145)

## 2014-04-27 NOTE — H&P (Signed)
  HPI: 34 y/o [id number] @ 3255w3d estimated gestational age presents for scheduled C-section due to prior shoulder dystocia.   no Leaking of Fluid,   no Vaginal Bleeding,   irregular Uterine Contractions,  + Fetal Movement.  ROS: no HA, no epigastric pain, no visual changes.    Pregnancy complicated by: 1) POP s/p urinary retention and pessary, now resolved  2)h/o shoulder dystocia & VAVD-reviewed R/B of recurrence of shoulder dystocia vs primary C-section. After much discussion- regarding risk and benefits of each option, pt desires primary C-seciton.  3) GDMA1- well controlled, last growth @ 2673w5d-vertex/posterior/EFW: 3002g, 6#10oz, (67%) 4) GERD- on Omeprazole daily.    Prenatal Transfer Tool  Maternal Diabetes: Yes:  Diabetes Type:  Diet controlled Genetic Screening: Normal Maternal Ultrasounds/Referrals: Normal Fetal Ultrasounds or other Referrals:  None Maternal Substance Abuse:  No Significant Maternal Medications:  Meds include: Other:  Omeprazole Significant Maternal Lab Results: Lab values include: Group B Strep negative   PNL:  GBS negative, Rub Immune, Hep B neg, RPR NR, HIV neg, GC/C neg, glucola:elevated Hgb 11.4 (04/20/14) Blood type: A positive  Immunizations: Tdap given (03/14/14) Flu given (01/11/14)  OBHx:  SABx 3,  FTNSVD, 7#12 oz, no complications VAVD, 8#2oz FTNSVD, 8#8oz, complicated by shoulder dysoctia  PMHx:  GERD Meds:  PNV, Omeprazole prn Allergy:   Allergies  Allergen Reactions  . Aleve [Naproxen Sodium]     Heart races and sweaty palms. Feels like panic attack  . Hydrocodone Nausea And Vomiting  . Latex Rash   SurgHx: none SocHx:   no Tobacco, no  EtOH, no Illicit Drugs  O: LMP 06/03/2013 Physical examination performed in office: 04/25/14 BP: 108/68 Gen. AAOx3, NAD CV.  RRR Resp. CTAB Abd. Gravid,  no tenderness,  no rigidity,  no guarding Extr.  no edema B/L FHT: 120 by doppler Toco: irregular SVE: 1/25/-3   Labs: see  orders  A/P:  34 y.o. [id number] @ 4455w3d EGA who presents for scheduled primary C-section -FWB:  Reassuring  -CBC, T&S to be performed prior to procedures -SCDs to OR -Ancef 2g IV to OR -GDMA1- accucheck prior to surgery -Risk benefits and alternatives of cesarean section were discussed with the patient including but not limited to infection, bleeding, damage to bowel, bladder and baby with the need for further surgery. Pt voiced understanding and desires to proceed.   Myna HidalgoJennifer Moksh Loomer, DO [phone] (pager) [phone] (office)

## 2014-04-28 LAB — RPR: RPR: NONREACTIVE

## 2014-04-29 ENCOUNTER — Encounter (HOSPITAL_COMMUNITY)
Admission: AD | Disposition: A | Discharge status: Home / Self Care | Payer: Self-pay | Source: Ambulatory Visit | Attending: Obstetrics & Gynecology

## 2014-04-29 ENCOUNTER — Encounter (HOSPITAL_COMMUNITY): Payer: Self-pay | Admitting: Anesthesiology

## 2014-04-29 ENCOUNTER — Inpatient Hospital Stay (HOSPITAL_COMMUNITY)
Admission: AD | Admit: 2014-04-29 | Discharge: 2014-05-01 | DRG: 766 | Disposition: A | Discharge status: Home / Self Care | Payer: 59 | Source: Ambulatory Visit | Attending: Obstetrics & Gynecology | Admitting: Obstetrics & Gynecology

## 2014-04-29 ENCOUNTER — Inpatient Hospital Stay (HOSPITAL_COMMUNITY): Payer: 59 | Admitting: Anesthesiology

## 2014-04-29 DIAGNOSIS — O200 Threatened abortion: Secondary | ICD-10-CM

## 2014-04-29 DIAGNOSIS — O24420 Gestational diabetes mellitus in childbirth, diet controlled: Secondary | ICD-10-CM | POA: Diagnosis present

## 2014-04-29 DIAGNOSIS — Z3493 Encounter for supervision of normal pregnancy, unspecified, third trimester: Secondary | ICD-10-CM

## 2014-04-29 DIAGNOSIS — O34533 Maternal care for retroversion of gravid uterus, third trimester: Principal | ICD-10-CM | POA: Diagnosis present

## 2014-04-29 DIAGNOSIS — O24410 Gestational diabetes mellitus in pregnancy, diet controlled: Secondary | ICD-10-CM

## 2014-04-29 DIAGNOSIS — Z3A39 39 weeks gestation of pregnancy: Secondary | ICD-10-CM | POA: Diagnosis present

## 2014-04-29 DIAGNOSIS — N854 Malposition of uterus: Secondary | ICD-10-CM | POA: Diagnosis present

## 2014-04-29 DIAGNOSIS — Z87891 Personal history of nicotine dependence: Secondary | ICD-10-CM | POA: Diagnosis not present

## 2014-04-29 DIAGNOSIS — Z91040 Latex allergy status: Secondary | ICD-10-CM

## 2014-04-29 DIAGNOSIS — K219 Gastro-esophageal reflux disease without esophagitis: Secondary | ICD-10-CM | POA: Diagnosis present

## 2014-04-29 DIAGNOSIS — Z3490 Encounter for supervision of normal pregnancy, unspecified, unspecified trimester: Secondary | ICD-10-CM

## 2014-04-29 DIAGNOSIS — O9989 Other specified diseases and conditions complicating pregnancy, childbirth and the puerperium: Secondary | ICD-10-CM | POA: Diagnosis present

## 2014-04-29 DIAGNOSIS — O9962 Diseases of the digestive system complicating childbirth: Secondary | ICD-10-CM | POA: Diagnosis present

## 2014-04-29 DIAGNOSIS — O288 Other abnormal findings on antenatal screening of mother: Secondary | ICD-10-CM

## 2014-04-29 HISTORY — DX: Encounter for supervision of normal pregnancy, unspecified, unspecified trimester: Z34.90

## 2014-04-29 LAB — GLUCOSE, CAPILLARY
GLUCOSE-CAPILLARY: 71 mg/dL (ref 70–99)
GLUCOSE-CAPILLARY: 82 mg/dL (ref 70–99)

## 2014-04-29 SURGERY — Surgical Case
Anesthesia: Spinal | Site: Abdomen

## 2014-04-29 MED ORDER — SCOPOLAMINE 1 MG/3DAYS TD PT72
MEDICATED_PATCH | TRANSDERMAL | Status: AC
  Administered 2014-04-29: 1.5 mg via TRANSDERMAL
  Filled 2014-04-29: qty 1

## 2014-04-29 MED ORDER — MORPHINE SULFATE 0.5 MG/ML IJ SOLN
INTRAMUSCULAR | Status: AC
  Filled 2014-04-29: qty 10

## 2014-04-29 MED ORDER — OXYTOCIN 10 UNIT/ML IJ SOLN
40.0000 [IU] | INTRAMUSCULAR | Status: DC | PRN
  Administered 2014-04-29: 40 [IU] via INTRAVENOUS

## 2014-04-29 MED ORDER — SIMETHICONE 80 MG PO CHEW
80.0000 mg | CHEWABLE_TABLET | ORAL | Status: DC
  Administered 2014-04-29: 80 mg via ORAL
  Filled 2014-04-29 (×2): qty 1

## 2014-04-29 MED ORDER — WITCH HAZEL-GLYCERIN EX PADS: 1.0000 "application " | MEDICATED_PAD | CUTANEOUS | Status: DC | PRN

## 2014-04-29 MED ORDER — CEFAZOLIN SODIUM-DEXTROSE 2-3 GM-% IV SOLR
2.0000 g | INTRAVENOUS | Status: AC
  Administered 2014-04-29: 2 g via INTRAVENOUS

## 2014-04-29 MED ORDER — DIPHENHYDRAMINE HCL 25 MG PO CAPS: 25.0000 mg | ORAL_CAPSULE | Freq: Four times a day (QID) | ORAL | Status: DC | PRN

## 2014-04-29 MED ORDER — NALBUPHINE HCL 10 MG/ML IJ SOLN: 5.0000 mg | INTRAMUSCULAR | Status: DC | PRN

## 2014-04-29 MED ORDER — NALOXONE HCL 0.4 MG/ML IJ SOLN: 0.4000 mg | INTRAMUSCULAR | Status: DC | PRN

## 2014-04-29 MED ORDER — METOCLOPRAMIDE HCL 5 MG/ML IJ SOLN: 10.0000 mg | Freq: Once | INTRAMUSCULAR | Status: DC | PRN

## 2014-04-29 MED ORDER — SCOPOLAMINE 1 MG/3DAYS TD PT72: 1.0000 | MEDICATED_PATCH | Freq: Once | TRANSDERMAL | Status: DC

## 2014-04-29 MED ORDER — MISOPROSTOL 200 MCG PO TABS
ORAL_TABLET | ORAL | Status: AC
  Filled 2014-04-29: qty 5

## 2014-04-29 MED ORDER — SIMETHICONE 80 MG PO CHEW
80.0000 mg | CHEWABLE_TABLET | Freq: Three times a day (TID) | ORAL | Status: DC
  Administered 2014-04-29 – 2014-05-01 (×5): 80 mg via ORAL
  Filled 2014-04-29 (×5): qty 1

## 2014-04-29 MED ORDER — ONDANSETRON HCL 4 MG/2ML IJ SOLN
INTRAMUSCULAR | Status: AC
  Filled 2014-04-29: qty 2

## 2014-04-29 MED ORDER — MENTHOL 3 MG MT LOZG: 1.0000 | LOZENGE | OROMUCOSAL | Status: DC | PRN

## 2014-04-29 MED ORDER — CITRIC ACID-SODIUM CITRATE 334-500 MG/5ML PO SOLN
30.0000 mL | Freq: Once | ORAL | Status: AC
  Administered 2014-04-29: 30 mL via ORAL

## 2014-04-29 MED ORDER — NALOXONE HCL 1 MG/ML IJ SOLN
1.0000 ug/kg/h | INTRAVENOUS | Status: DC | PRN
  Filled 2014-04-29: qty 2

## 2014-04-29 MED ORDER — ALBUTEROL SULFATE (2.5 MG/3ML) 0.083% IN NEBU: 3.0000 mL | INHALATION_SOLUTION | Freq: Four times a day (QID) | RESPIRATORY_TRACT | Status: DC | PRN

## 2014-04-29 MED ORDER — MEPERIDINE HCL 25 MG/ML IJ SOLN: 6.2500 mg | INTRAMUSCULAR | Status: DC | PRN

## 2014-04-29 MED ORDER — OXYTOCIN 10 UNIT/ML IJ SOLN
INTRAMUSCULAR | Status: AC
  Filled 2014-04-29: qty 4

## 2014-04-29 MED ORDER — ONDANSETRON HCL 4 MG/2ML IJ SOLN: 4.0000 mg | INTRAMUSCULAR | Status: DC | PRN

## 2014-04-29 MED ORDER — DIBUCAINE 1 % RE OINT: 1.0000 "application " | TOPICAL_OINTMENT | RECTAL | Status: DC | PRN

## 2014-04-29 MED ORDER — PHENYLEPHRINE 8 MG IN D5W 100 ML (0.08MG/ML) PREMIX OPTIME
INJECTION | INTRAVENOUS | Status: AC
  Filled 2014-04-29: qty 100

## 2014-04-29 MED ORDER — ONDANSETRON HCL 4 MG PO TABS: 4.0000 mg | ORAL_TABLET | ORAL | Status: DC | PRN

## 2014-04-29 MED ORDER — TETANUS-DIPHTH-ACELL PERTUSSIS 5-2.5-18.5 LF-MCG/0.5 IM SUSP: 0.5000 mL | Freq: Once | INTRAMUSCULAR | Status: DC

## 2014-04-29 MED ORDER — NALBUPHINE HCL 10 MG/ML IJ SOLN: 5.0000 mg | Freq: Once | INTRAMUSCULAR | Status: AC | PRN

## 2014-04-29 MED ORDER — MORPHINE SULFATE (PF) 0.5 MG/ML IJ SOLN
INTRAMUSCULAR | Status: DC | PRN
  Administered 2014-04-29: .15 mg via INTRATHECAL

## 2014-04-29 MED ORDER — CEFAZOLIN SODIUM-DEXTROSE 2-3 GM-% IV SOLR
INTRAVENOUS | Status: AC
  Filled 2014-04-29: qty 50

## 2014-04-29 MED ORDER — LACTATED RINGERS IV SOLN
INTRAVENOUS | Status: DC
  Administered 2014-04-29 (×3): via INTRAVENOUS

## 2014-04-29 MED ORDER — BUPIVACAINE IN DEXTROSE 0.75-8.25 % IT SOLN
INTRATHECAL | Status: DC | PRN
  Administered 2014-04-29: 1.4 mL via INTRATHECAL

## 2014-04-29 MED ORDER — ONDANSETRON HCL 4 MG/2ML IJ SOLN
INTRAMUSCULAR | Status: DC | PRN
Start: 2014-04-29 — End: 2014-04-29
  Administered 2014-04-29: 4 mg via INTRAVENOUS

## 2014-04-29 MED ORDER — SODIUM CHLORIDE 0.9 % IJ SOLN: 3.0000 mL | INTRAMUSCULAR | Status: DC | PRN

## 2014-04-29 MED ORDER — FENTANYL CITRATE 0.05 MG/ML IJ SOLN
INTRAMUSCULAR | Status: AC
  Filled 2014-04-29: qty 2

## 2014-04-29 MED ORDER — OXYTOCIN 40 UNITS IN LACTATED RINGERS INFUSION - SIMPLE MED: 62.5000 mL/h | INTRAVENOUS | Status: AC

## 2014-04-29 MED ORDER — CITRIC ACID-SODIUM CITRATE 334-500 MG/5ML PO SOLN
ORAL | Status: AC
  Administered 2014-04-29: 30 mL via ORAL
  Filled 2014-04-29: qty 15

## 2014-04-29 MED ORDER — LANOLIN HYDROUS EX OINT: 1.0000 "application " | TOPICAL_OINTMENT | CUTANEOUS | Status: DC | PRN

## 2014-04-29 MED ORDER — LACTATED RINGERS IV SOLN: Freq: Once | INTRAVENOUS | Status: DC

## 2014-04-29 MED ORDER — LACTATED RINGERS IV SOLN
INTRAVENOUS | Status: DC
  Administered 2014-04-29: 21:00:00 via INTRAVENOUS

## 2014-04-29 MED ORDER — SCOPOLAMINE 1 MG/3DAYS TD PT72
1.0000 | MEDICATED_PATCH | Freq: Once | TRANSDERMAL | Status: DC
  Administered 2014-04-29: 1.5 mg via TRANSDERMAL

## 2014-04-29 MED ORDER — EPHEDRINE SULFATE 50 MG/ML IJ SOLN
INTRAMUSCULAR | Status: DC | PRN
  Administered 2014-04-29 (×2): 10 mg via INTRAVENOUS

## 2014-04-29 MED ORDER — SENNOSIDES-DOCUSATE SODIUM 8.6-50 MG PO TABS
2.0000 | ORAL_TABLET | ORAL | Status: DC
  Administered 2014-04-29: 2 via ORAL
  Filled 2014-04-29 (×2): qty 2

## 2014-04-29 MED ORDER — IBUPROFEN 600 MG PO TABS
600.0000 mg | ORAL_TABLET | Freq: Four times a day (QID) | ORAL | Status: DC
  Administered 2014-04-29 – 2014-05-01 (×8): 600 mg via ORAL
  Filled 2014-04-29 (×8): qty 1

## 2014-04-29 MED ORDER — FENTANYL CITRATE 0.05 MG/ML IJ SOLN: 25.0000 ug | INTRAMUSCULAR | Status: DC | PRN

## 2014-04-29 MED ORDER — FENTANYL CITRATE 0.05 MG/ML IJ SOLN
INTRAMUSCULAR | Status: DC | PRN
  Administered 2014-04-29: 25 ug via INTRATHECAL

## 2014-04-29 MED ORDER — ACETAMINOPHEN 325 MG PO TABS: 650.0000 mg | ORAL_TABLET | ORAL | Status: DC | PRN

## 2014-04-29 MED ORDER — DIPHENHYDRAMINE HCL 25 MG PO CAPS
25.0000 mg | ORAL_CAPSULE | ORAL | Status: DC | PRN
  Filled 2014-04-29: qty 1

## 2014-04-29 MED ORDER — ONDANSETRON HCL 4 MG/2ML IJ SOLN: 4.0000 mg | Freq: Three times a day (TID) | INTRAMUSCULAR | Status: DC | PRN

## 2014-04-29 MED ORDER — PHENYLEPHRINE 8 MG IN D5W 100 ML (0.08MG/ML) PREMIX OPTIME
INJECTION | INTRAVENOUS | Status: DC | PRN
  Administered 2014-04-29: 60 ug/min via INTRAVENOUS

## 2014-04-29 MED ORDER — SIMETHICONE 80 MG PO CHEW: 80.0000 mg | CHEWABLE_TABLET | ORAL | Status: DC | PRN

## 2014-04-29 MED ORDER — DIPHENHYDRAMINE HCL 50 MG/ML IJ SOLN: 12.5000 mg | INTRAMUSCULAR | Status: DC | PRN

## 2014-04-29 MED ORDER — SODIUM CHLORIDE 0.9 % IR SOLN
Status: DC | PRN
  Administered 2014-04-29: 1000 mL

## 2014-04-29 MED ORDER — ZOLPIDEM TARTRATE 5 MG PO TABS: 5.0000 mg | ORAL_TABLET | Freq: Every evening | ORAL | Status: DC | PRN

## 2014-04-29 MED ORDER — PRENATAL MULTIVITAMIN CH
1.0000 | ORAL_TABLET | Freq: Every day | ORAL | Status: DC
  Administered 2014-04-30 – 2014-05-01 (×2): 1 via ORAL
  Filled 2014-04-29 (×2): qty 1

## 2014-04-29 MED ORDER — EPHEDRINE 5 MG/ML INJ
INTRAVENOUS | Status: AC
Start: 2014-04-29 — End: 2014-04-29
  Filled 2014-04-29: qty 10

## 2014-04-29 MED ORDER — PANTOPRAZOLE SODIUM 20 MG PO TBEC
20.0000 mg | DELAYED_RELEASE_TABLET | Freq: Two times a day (BID) | ORAL | Status: DC
  Administered 2014-04-29 – 2014-05-01 (×3): 20 mg via ORAL
  Filled 2014-04-29 (×6): qty 1

## 2014-04-29 SURGICAL SUPPLY — 34 items
BARRIER ADHS 3X4 INTERCEED (GAUZE/BANDAGES/DRESSINGS) ×3 IMPLANT
BENZOIN TINCTURE PRP APPL 2/3 (GAUZE/BANDAGES/DRESSINGS) ×3 IMPLANT
CLAMP CORD UMBIL (MISCELLANEOUS) IMPLANT
CLOSURE WOUND 1/2 X4 (GAUZE/BANDAGES/DRESSINGS) ×1
CLOTH BEACON ORANGE TIMEOUT ST (SAFETY) ×3 IMPLANT
DRAPE SHEET LG 3/4 BI-LAMINATE (DRAPES) IMPLANT
DRSG OPSITE POSTOP 4X10 (GAUZE/BANDAGES/DRESSINGS) ×3 IMPLANT
DURAPREP 26ML APPLICATOR (WOUND CARE) ×3 IMPLANT
ELECT REM PT RETURN 9FT ADLT (ELECTROSURGICAL) ×3
ELECTRODE REM PT RTRN 9FT ADLT (ELECTROSURGICAL) ×1 IMPLANT
EXTRACTOR VACUUM KIWI (MISCELLANEOUS) IMPLANT
GLOVE BIOGEL PI IND STRL 6.5 (GLOVE) ×1 IMPLANT
GLOVE BIOGEL PI INDICATOR 6.5 (GLOVE) ×2
GLOVE ECLIPSE 6.5 STRL STRAW (GLOVE) ×3 IMPLANT
GOWN STRL REUS W/TWL LRG LVL3 (GOWN DISPOSABLE) ×6 IMPLANT
KIT ABG SYR 3ML LUER SLIP (SYRINGE) IMPLANT
LIQUID BAND (GAUZE/BANDAGES/DRESSINGS) IMPLANT
NEEDLE HYPO 25X5/8 SAFETYGLIDE (NEEDLE) IMPLANT
NS IRRIG 1000ML POUR BTL (IV SOLUTION) ×3 IMPLANT
PACK C SECTION WH (CUSTOM PROCEDURE TRAY) ×3 IMPLANT
PAD ABD 7.5X8 STRL (GAUZE/BANDAGES/DRESSINGS) ×3 IMPLANT
PAD ABD 8X7 1/2 STERILE (GAUZE/BANDAGES/DRESSINGS) ×3 IMPLANT
PAD OB MATERNITY 4.3X12.25 (PERSONAL CARE ITEMS) ×3 IMPLANT
RTRCTR C-SECT PINK 25CM LRG (MISCELLANEOUS) ×3 IMPLANT
STRIP CLOSURE SKIN 1/2X4 (GAUZE/BANDAGES/DRESSINGS) ×2 IMPLANT
SUT PLAIN 0 NONE (SUTURE) IMPLANT
SUT PLAIN 2 0 XLH (SUTURE) IMPLANT
SUT VIC AB 0 CT1 27 (SUTURE) ×4
SUT VIC AB 0 CT1 27XBRD ANBCTR (SUTURE) ×2 IMPLANT
SUT VIC AB 0 CTX 36 (SUTURE) ×6
SUT VIC AB 0 CTX36XBRD ANBCTRL (SUTURE) ×3 IMPLANT
SUT VIC AB 4-0 KS 27 (SUTURE) ×3 IMPLANT
TOWEL OR 17X24 6PK STRL BLUE (TOWEL DISPOSABLE) ×3 IMPLANT
TRAY FOLEY CATH 16FR SILVER (SET/KITS/TRAYS/PACK) ×3 IMPLANT

## 2014-04-29 NOTE — Interval H&P Note (Signed)
History and Physical Interval Note:  04/29/2014 12:41 PM  Tiffany Clark  has presented today for surgery, with the diagnosis of Z87.59 History of Shoulder Dystocia  The various methods of treatment have been discussed with the patient and family. After consideration of risks, benefits and other options for treatment, the patient has consented to  Procedure(s): CESAREAN SECTION (N/A) as a surgical intervention .  The patient's history has been reviewed, patient examined, no change in status, stable for surgery.  I have reviewed the patient's chart and labs.  Questions were answered to the patient's satisfaction.     Myna HidalgoZAN, Charizma Gardiner, M

## 2014-04-29 NOTE — Op Note (Signed)
PreOp Diagnosis: Intrauterine pregnancy @ 2812w3d, Prior shoulder dystocia, desire for primary C-section PostOp Diagnosis: same Procedure: Primary LTCS Surgeon: Dr. Myna HidalgoJennifer Jermale Crass Anesthesia: spinal Complications: none EBL: 750cc UOP: 100cc Fluids: 2300cc  Findings: Retroverted uterus, normal tubes and ovaries bilaterally, Female infant from vertex presentation, APGARs 9,9  PROCEDURE:  Informed consent was obtained from the patient with risks, benefits, complications, treatment options, and expected outcomes discussed with the patient.  The patient concurred with the proposed plan, giving informed consent with form signed.   The patient was taken to Operating Room, and identified with the procedure verified as C-Section Delivery with Time Out. With induction of anesthesia, the patient was prepped and draped in the usual sterile fashion. A Pfannenstiel incision was made and carried down through the subcutaneous tissue to the fascia. The fascia was incised in the midline and extended transversely. The superior aspect of the fascial incision was grasped with Kochers elevated and the underlying muscle dissected off. The inferior aspect of the facial incision was in similar fashion, grasped elevated and rectus muscles dissected off. The peritoneum was identified and entered. Peritoneal incision was extended longitudinally. The Alexis retractor was inserted.  The utero-vesical peritoneal reflection was identified and incised transversely with the [facility], the incision extended laterally, the bladder flap created digitally. A low transverse uterine incision was made and the infants head delivered atraumatically. After the umbilical cord was clamped and cut cord blood was obtained for evaluation.   The placenta was removed intact and appeared normal. The uterine outline, tubes and ovaries appeared normal. The uterine incision was closed with running locked sutures of 0 Vicryl and a second layer of the  same stitch was used in an imbricating fashion.  Excellent hemostasis was obtained.  The pericolic gutters were then cleared of all clots and debris. Interceed was placed over the hysterotomy.  The Alexis retractor was removed.  The peritoneum was closed with 2-0 vicryl in a running fashion. The fascia was then reapproximated with running sutures of 0 Vicryl. The skin was closed with 4-0 vicryl in a subcuticular fashion.  Instrument, sponge, and needle counts were correct prior the abdominal closure and at the conclusion of the case. The patient was taken to recovery in stable condition.  Myna HidalgoJennifer Jmari Pelc, DO [phone] (pager) [phone] (office)

## 2014-04-29 NOTE — Anesthesia Preprocedure Evaluation (Signed)
Anesthesia Evaluation  Patient identified by MRN, date of birth, ID band Patient awake    Reviewed: Allergy & Precautions, NPO status , Patient's Chart, lab work & pertinent test results  Airway Mallampati: II  TM Distance: >3 FB Neck ROM: Full    Dental no notable dental hx. (+) Teeth Intact   Pulmonary asthma , former smoker,  breath sounds clear to auscultation  Pulmonary exam normal       Cardiovascular negative cardio ROS  Rhythm:Regular Rate:Normal     Neuro/Psych  Headaches, negative psych ROS   GI/Hepatic Neg liver ROS, GERD-  Medicated and Controlled,  Endo/Other  diabetes, Well Controlled, GestationalObesity  Renal/GU negative Renal ROS     Musculoskeletal negative musculoskeletal ROS (+)   Abdominal (+) + obese,   Peds  Hematology negative hematology ROS (+)   Anesthesia Other Findings   Reproductive/Obstetrics Hx/o Shoulder dystocia                             Anesthesia Physical Anesthesia Plan  ASA: II  Anesthesia Plan: Spinal   Post-op Pain Management:    Induction:   Airway Management Planned: Natural Airway  Additional Equipment:   Intra-op Plan:   Post-operative Plan:   Informed Consent: I have reviewed the patients History and Physical, chart, labs and discussed the procedure including the risks, benefits and alternatives for the proposed anesthesia with the patient or authorized representative who has indicated his/her understanding and acceptance.     Plan Discussed with: Anesthesiologist, CRNA and Surgeon  Anesthesia Plan Comments:         Anesthesia Quick Evaluation

## 2014-04-29 NOTE — Anesthesia Procedure Notes (Signed)
Spinal Patient location during procedure: OR Start time: 04/29/2014 1:30 PM Staffing Anesthesiologist: Jeweldean Drohan A. Performed by: anesthesiologist  Preanesthetic Checklist Completed: patient identified, site marked, surgical consent, pre-op evaluation, timeout performed, IV checked, risks and benefits discussed and monitors and equipment checked Spinal Block Patient position: sitting Prep: site prepped and draped and DuraPrep Patient monitoring: heart rate, cardiac monitor, continuous pulse ox and blood pressure Approach: midline Location: L3-4 Injection technique: single-shot Needle Needle type: Sprotte  Needle gauge: 24 G Needle length: 9 cm Needle insertion depth: 5 cm Assessment Sensory level: T4 Additional Notes Patient tolerated procedure well. Adequate sensory level.

## 2014-04-29 NOTE — Anesthesia Postprocedure Evaluation (Signed)
  Anesthesia Post-op Note  Patient: Tiffany Clark  Procedure(s) Performed: Procedure(s): CESAREAN SECTION (N/A)  Patient Location: Mother/Baby  Anesthesia Type:Spinal  Level of Consciousness: awake, alert  and oriented  Airway and Oxygen Therapy: Patient Spontanous Breathing  Post-op Pain: none  Post-op Assessment: Post-op Vital signs reviewed and Patient's Cardiovascular Status Stable  Post-op Vital Signs: Reviewed and stable  Last Vitals:  Filed Vitals:   04/29/14 1630  BP: 99/51  Pulse: 66  Temp: 36.6 C  Resp: 16    Complications: No apparent anesthesia complications

## 2014-04-29 NOTE — Anesthesia Postprocedure Evaluation (Signed)
  Anesthesia Post-op Note  Patient: Tiffany Clark  Procedure(s) Performed: Procedure(s): CESAREAN SECTION (N/A)  Patient Location: PACU  Anesthesia Type:Spinal  Level of Consciousness: awake, alert  and oriented  Airway and Oxygen Therapy: Patient Spontanous Breathing  Post-op Pain: none  Post-op Assessment: Post-op Vital signs reviewed, Patient's Cardiovascular Status Stable, Respiratory Function Stable, Patent Airway, No signs of Nausea or vomiting, Pain level controlled, No headache, No backache and No residual numbness  Post-op Vital Signs: Reviewed and stable  Last Vitals:  Filed Vitals:   04/29/14 1445  BP:   Pulse:   Temp: 36.6 C  Resp:     Complications: No apparent anesthesia complications

## 2014-04-29 NOTE — Addendum Note (Signed)
Addendum  created 04/29/14 1733 by Janeece Ageeynthia W Mikayela Deats, CRNA   Modules edited: Notes Section   Notes Section:  File: [id number]

## 2014-04-29 NOTE — Transfer of Care (Signed)
Immediate Anesthesia Transfer of Care Note  Patient: Tiffany Clark  Procedure(s) Performed: Procedure(s): CESAREAN SECTION (N/A)  Patient Location: PACU  Anesthesia Type:Spinal  Level of Consciousness: awake, alert  and oriented  Airway & Oxygen Therapy: Patient Spontanous Breathing  Post-op Assessment: Report given to RN and Post -op Vital signs reviewed and stable  Post vital signs: Reviewed and stable  Last Vitals:  Filed Vitals:   04/29/14 1121  BP: 115/79  Pulse: 94  Temp: 36.6 C  Resp: 18    Complications: No apparent anesthesia complications

## 2014-04-29 NOTE — Lactation Note (Signed)
This note was copied from the chart of Tiffany Donney Dicengela Fawver. Lactation Consultation Note Initial visit at 8 hours of age.  Mom reports a good feedings and baby is now sleepy.  Baby is STS after bath and asleep.  Mom is determined to breastfeed longer than a month like with her other children.  [facility] resources given and discussed.  Encouraged to feed with early cues on demand.  Early newborn behavior discussed.  Hand expression demonstrated with colostrum visible.  Encouraged mom to work on hand expression with feedings.   Mom to call for assist as needed.    Patient Name: Tiffany Clark ZOXWR'UToday's Date: 04/29/2014 Reason for consult: Initial assessment   Maternal Data Has patient been taught Hand Expression?: Yes Does the patient have breastfeeding experience prior to this delivery?: Yes  Feeding Feeding Type: Breast Fed Length of feed: 0 min  LATCH Score/Interventions Latch: Grasps breast easily, tongue down, lips flanged, rhythmical sucking.  Audible Swallowing: A few with stimulation Intervention(s): Skin to skin  Type of Nipple: Everted at rest and after stimulation  Comfort (Breast/Nipple): Soft / non-tender     Hold (Positioning): No assistance needed to correctly position infant at breast. Intervention(s): Breastfeeding basics reviewed  LATCH Score: 9  Lactation Tools Discussed/Used     Consult Status Consult Status: Follow-up Date: 04/30/14 Follow-up type: In-patient    Beverely RisenShoptaw, Arvella MerlesJana Lynn 04/29/2014, 9:58 PM

## 2014-04-30 LAB — CBC
HEMATOCRIT: 30.7 % — AB (ref 36.0–46.0)
HEMOGLOBIN: 10.2 g/dL — AB (ref 12.0–15.0)
MCH: 29.2 pg (ref 26.0–34.0)
MCHC: 33.2 g/dL (ref 30.0–36.0)
MCV: 88 fL (ref 78.0–100.0)
PLATELETS: 244 10*3/uL (ref 150–400)
RBC: 3.49 MIL/uL — ABNORMAL LOW (ref 3.87–5.11)
RDW: 13.6 % (ref 11.5–15.5)
WBC: 10 10*3/uL (ref 4.0–10.5)

## 2014-04-30 LAB — BIRTH TISSUE RECOVERY COLLECTION (PLACENTA DONATION)

## 2014-04-30 NOTE — Lactation Note (Signed)
This note was copied from the chart of Tiffany Clark. Lactation Consultation Note  Patient Name: Tiffany Clark ZOXWR'UToday's Date: 04/30/2014 Reason for consult: Follow-up assessment Baby 27 hours of life. Mom nursing baby [facility]. Visitors in the room and mom covered with a blanket on couch. Mom states that baby nursing well every 2.5-3 hours and she states that she is hearing swallows when baby nursing. Mom states she feels her milk coming in and she denies any nipple pain. Enc mom to ask for assistance as needed.   Maternal Data    Feeding Feeding Type: Breast Fed  LATCH Score/Interventions                      Lactation Tools Discussed/Used     Consult Status Consult Status: PRN Follow-up type: In-patient    Geralynn OchsWILLIARD, Kaylub Detienne 04/30/2014, 5:47 PM

## 2014-04-30 NOTE — Progress Notes (Signed)
Subjective: Postpartum Day 1: Cesarean Delivery Patient reports tolerating PO and + flatus.    Objective: Vital signs in last 24 hours: Temp:  [97.8 F (36.6 C)-98.3 F (36.8 C)] 98.3 F (36.8 C) (01/30 0330) Pulse Rate:  [59-94] 66 (01/30 0330) Resp:  [9-24] 16 (01/30 0330) BP: (92-115)/(45-79) 106/56 mmHg (01/30 0330) SpO2:  [94 %-100 %] 96 % (01/30 0330) Weight:  [87.998 kg (194 lb)] 87.998 kg (194 lb) (01/29 1630)  Physical Exam:  General: alert and cooperative Lochia: appropriate Uterine Fundus: firm Incision: bandage clean dry and intact  DVT Evaluation: No evidence of DVT seen on physical exam.   Recent Labs  04/30/14 0600  HGB 10.2*  HCT 30.7*    Assessment/Plan: Status post Cesarean section. Doing well postoperatively.  Continue current care.  Romelo Sciandra J. 04/30/2014, 10:06 AM

## 2014-05-01 MED ORDER — OXYCODONE HCL 5 MG PO TABS: ORAL_TABLET | ORAL | Status: DC

## 2014-05-01 MED ORDER — IBUPROFEN 600 MG PO TABS: 600.0000 mg | ORAL_TABLET | Freq: Four times a day (QID) | ORAL | Status: DC | PRN

## 2014-05-01 NOTE — Discharge Summary (Signed)
Obstetric Discharge Summary Reason for Admission: cesarean section Prenatal Procedures: none Intrapartum Procedures: cesarean: low cervical, transverse Postpartum Procedures: none Complications-Operative and Postpartum: none HEMOGLOBIN  Date Value Ref Range Status  04/30/2014 10.2* 12.0 - 15.0 g/dL Final   HCT  Date Value Ref Range Status  04/30/2014 30.7* 36.0 - 46.0 % Final    Physical Exam:  General: alert and cooperative Lochia: appropriate Uterine Fundus: firm Incision: healing well DVT Evaluation: No evidence of DVT seen on physical exam.  Discharge Diagnoses: Term Pregnancy-delivered  Discharge Information: Date: 05/01/2014 Activity: pelvic rest Diet: routine Medications: PNV, Ibuprofen and oxycodone Condition: stable Instructions: refer to practice specific booklet Discharge to: home Follow-up Information    Follow up with Myna HidalgoZAN, JENNIFER, M, DO. Schedule an appointment as soon as possible for a visit in 2 weeks.   Specialty:  Obstetrics and Gynecology   Why:  incision check    Contact information:   [address] [place] [id number] [phone]       Newborn Data: Live born female  Birth Weight: 8 lb 7 oz (3827 g) APGAR: 9, 9  Home with mother.  Laquitta Dominski J. 05/01/2014, 9:36 AM

## 2014-05-01 NOTE — Lactation Note (Signed)
This note was copied from the chart of Tiffany Clark. Lactation Consultation Note  Follow up visit made prior to discharge.  Mom is currently feeding baby. Baby is cluster feeding.  She denies concerns of questions at present time.  Outpatient services encouraged prn.  Patient Name: Tiffany Donney Dicengela Mcclaran WUJWJ'XToday's Date: 05/01/2014     Maternal Data    Feeding    LATCH Score/Interventions                      Lactation Tools Discussed/Used     Consult Status      Huston FoleyMOULDEN, Jamarii Banks S 05/01/2014, 10:04 AM

## 2014-05-02 ENCOUNTER — Encounter (HOSPITAL_COMMUNITY): Payer: Self-pay | Admitting: Obstetrics & Gynecology

## 2014-05-17 ENCOUNTER — Ambulatory Visit (HOSPITAL_COMMUNITY)
Admission: RE | Admit: 2014-05-17 | Discharge: 2014-05-17 | Disposition: A | Discharge status: Home / Self Care | Payer: 59 | Source: Ambulatory Visit | Attending: Obstetrics & Gynecology | Admitting: Obstetrics & Gynecology

## 2014-05-17 NOTE — Lactation Note (Addendum)
Lactation Consult  Mother's reason for visit: difficult latch with cracked R nipple, infant has also had slow weight gain.  Visit Type: feeding assessment Appointment Notes:  Mother has had cracked sore nipples since day of discharge. Mothers left nipple is no longer cracked but is still very pink and tender. Mothers Right nipple has a crack that is healing. Mother has concerns about weight gain.mother states that infant has had continued slow weight gain but is now gaining since last week. She began supplementing with formula 1-2 ounces on Friday every 2-3 hours. Dr Nash Dimmer recommend supplementing if mother desired. Mother did take a few days over the weekend to bottle feed and allow nipples to heal. Mother began breast feeding again on /27/16 - Today         One Day Eight Hours  View All    01/29 01/31   Time:  1350 2318 0115    Weight   Weight  8 lb 7... 8 lb 4... 7 lb 1...  Weight       ##[id number]^[phone]^[id number]([phone]^        ________________________________________________________________________  Mother's Name: Tiffany Clark Type of delivery:  C-Section Breastfeeding Experience:  1st child breastfed for 6 weeks, 2nd child for 2 weeks and 3rd child for 5 days. Mother states she wants to breastfeed this child for at least 6 months Maternal Medical Conditions:  none Maternal Medications:  Prenatal vits  ________________________________________________________________________  Breastfeeding History (Post Discharge)  Frequency of breastfeeding: Tiffany Clark has been cluster feeding Duration  of feeding:  12-22 mins on each breast  Supplementation  Formula:  Volume 30-7760ml Frequency:  Every 2-3 hours Total volume per day:  120ml       Brand: Daron OfferGerber Goodstart    Method:  Bottle    Infant Intake and Output Assessment  Voids:  8-10 in 24 hrs.  Color:  Clear yellow Stools:  8-10 in 24 hrs.  Color:  Yellow  ________________________________________________________________________  Maternal Breast Assessment  Breast:  Full Nipple:  Erect Pain level:  1 Pain interventions:  Bra  _______________________________________________________________________ Feeding Assessment/Evaluation : infant has a small  mouth. She also has a slight high palate. Observed that she could have a posterior tongue tie and a upper lip tie. Reviewed tips for deeper latch  Mother was assisted with better latch techniques which were very helpful . Mother denied having only a pain scale of a #1 on the initial latch which soon when away. Mother advised to continue to follow plan of deeper  latch technique and continue to allow infant to feed frequently to drain breast well. Advised to get all purpose nipple ointment. Suggested follow up and consideration of frenotomy if nipples continue to be cracked and do not heal.   Initial feeding assessment:  Infant's oral assessment:  Slight high palate with slight possible posterior tie. Infant cups her tongue. She mouth is small.     Positioning:  Football   Right breast  LATCH documentation:  Latch:  2 = Grasps breast easily, tongue down, lips flanged, rhythmical sucking.  Audible swallowing:  2 = Spontaneous and intermittent  Type of nipple:  2 = Everted at rest and after stimulation  Comfort (Breast/Nipple):  0 = Engorged, cracked, bleeding, large blisters, severe discomfort  Hold (Positioning):  1 = Assistance needed to correctly position infant at breast and maintain latch  LATCH score:  7  Attached assessment:  Deep    Lips flanged:  Yes.    Lips untucked:  No.  Suck assessment:  Nutritive    Pre-feed weight:  3956 g  (8 lb. 11.5 oz.) Post-feed weight: 4032  g (8lb. 14.2 oz.) Amount transferred:  76 ml   Total amount transferred:  76 ml  Advised to phone OB provider for Rx for all purpose nipple cream Mother to pay close attention to proper latch and use good breast support Suggested to unlatch infant with increases pain Advised to continue to supplement as needed Recommend follow up for weight check to Peds provider in one week Advised to follow up with Lactation services if continue to have painful latch Discussed having provider evaluate for tight  frenula

## 2015-07-21 ENCOUNTER — Other Ambulatory Visit (HOSPITAL_COMMUNITY)
Admission: RE | Admit: 2015-07-21 | Discharge: 2015-07-21 | Disposition: A | Discharge status: Home / Self Care | Payer: 59 | Source: Ambulatory Visit | Attending: Obstetrics & Gynecology | Admitting: Obstetrics & Gynecology

## 2015-07-21 ENCOUNTER — Other Ambulatory Visit: Payer: Self-pay | Admitting: Obstetrics & Gynecology

## 2015-07-21 DIAGNOSIS — Z1151 Encounter for screening for human papillomavirus (HPV): Secondary | ICD-10-CM | POA: Insufficient documentation

## 2015-07-21 DIAGNOSIS — Z01411 Encounter for gynecological examination (general) (routine) with abnormal findings: Secondary | ICD-10-CM | POA: Diagnosis present

## 2015-07-21 DIAGNOSIS — R87810 Cervical high risk human papillomavirus (HPV) DNA test positive: Secondary | ICD-10-CM | POA: Insufficient documentation

## 2015-07-24 LAB — CYTOLOGY - PAP

## 2019-12-10 DIAGNOSIS — R87810 Cervical high risk human papillomavirus (HPV) DNA test positive: Secondary | ICD-10-CM | POA: Diagnosis not present

## 2019-12-10 DIAGNOSIS — L309 Dermatitis, unspecified: Secondary | ICD-10-CM | POA: Diagnosis not present

## 2019-12-10 DIAGNOSIS — T8332XA Displacement of intrauterine contraceptive device, initial encounter: Secondary | ICD-10-CM | POA: Diagnosis not present

## 2019-12-10 DIAGNOSIS — Z01411 Encounter for gynecological examination (general) (routine) with abnormal findings: Secondary | ICD-10-CM | POA: Diagnosis not present

## 2019-12-14 DIAGNOSIS — T8332XA Displacement of intrauterine contraceptive device, initial encounter: Secondary | ICD-10-CM | POA: Diagnosis not present

## 2019-12-14 DIAGNOSIS — Z30431 Encounter for routine checking of intrauterine contraceptive device: Secondary | ICD-10-CM | POA: Diagnosis not present

## 2020-01-11 ENCOUNTER — Other Ambulatory Visit: Payer: Self-pay

## 2020-01-11 ENCOUNTER — Encounter: Payer: Self-pay | Admitting: Internal Medicine

## 2020-01-11 ENCOUNTER — Other Ambulatory Visit (HOSPITAL_COMMUNITY)
Admission: RE | Admit: 2020-01-11 | Discharge: 2020-01-11 | Disposition: A | Discharge status: Home / Self Care | Payer: 59 | Source: Ambulatory Visit | Attending: Internal Medicine | Admitting: Internal Medicine

## 2020-01-11 ENCOUNTER — Ambulatory Visit (INDEPENDENT_AMBULATORY_CARE_PROVIDER_SITE_OTHER): Payer: 59 | Admitting: Internal Medicine

## 2020-01-11 VITALS — BP 114/71 | HR 68 | Temp 97.0°F | Resp 18 | Ht 64.0 in | Wt 174.0 lb

## 2020-01-11 DIAGNOSIS — Z7689 Persons encountering health services in other specified circumstances: Secondary | ICD-10-CM | POA: Insufficient documentation

## 2020-01-11 DIAGNOSIS — Z131 Encounter for screening for diabetes mellitus: Secondary | ICD-10-CM

## 2020-01-11 DIAGNOSIS — E669 Obesity, unspecified: Secondary | ICD-10-CM | POA: Insufficient documentation

## 2020-01-11 DIAGNOSIS — E663 Overweight: Secondary | ICD-10-CM | POA: Diagnosis not present

## 2020-01-11 DIAGNOSIS — Z87442 Personal history of urinary calculi: Secondary | ICD-10-CM | POA: Diagnosis not present

## 2020-01-11 DIAGNOSIS — Z8632 Personal history of gestational diabetes: Secondary | ICD-10-CM

## 2020-01-11 LAB — POCT GLYCOSYLATED HEMOGLOBIN (HGB A1C)
HbA1c POC (<> result, manual entry): 5.4 % (ref 4.0–5.6)
HbA1c, POC (controlled diabetic range): 5.4 % (ref 0.0–7.0)
HbA1c, POC (prediabetic range): 5.4 % — AB (ref 5.7–6.4)
Hemoglobin A1C: 5.4 % (ref 4.0–5.6)

## 2020-01-11 NOTE — Assessment & Plan Note (Signed)
H/o gestational diabetes HbA1C: 5.4 in the office today Advised for low carbohydrate diet to decrease risk of developing diabetes

## 2020-01-11 NOTE — Assessment & Plan Note (Signed)
Diet modification and moderate exercise

## 2020-01-11 NOTE — Progress Notes (Signed)
New Patient Office Visit  Subjective:  Patient ID: JAMONICA SCHOFF, female    DOB: 14-Mar-1981  Age: 39 y.o. MRN: [id number]  CC:  Chief Complaint  Patient presents with  . New Patient (Initial Visit)    new pt hasnt had pcp in awhile just establishing care     HPI RHAELYN GIRON is a 39 year old female with past medical history of nephrolithiasis and gestational diabetes presents for establishing care.  Patient has not had PCP follow-up for long time.  Patient has been in good health overall and denies any active complaints currently.  She takes multivitamin supplements every day.  She denies any headache, dizziness, neck pain, dyspnea, palpitations, nausea, vomiting, abdominal pain, dysuria, hematuria or LE swelling.  Patient has a history of nephrolithiasis, s/p lithotripsy once and other episodes treated conservatively.  She has intermittent flank pain, which resolves on its own.  She attributes the pain to possible small kidney stones.  She tries to keep adequate fluid intake.  Patient had last Pap smear in 12/2019, which was negative for intraepithelial lesion or malignancy and was negative for HPV.  She follows up with OB/GYN for annual Pap smear as she had positive HPV in the past, last positive result in 2020.  Patient is up-to-date with vaccinations including Covid vaccinations and flu vaccination.  Past Medical History:  Diagnosis Date  . Abnormal Pap smear   . Asthma    rarely uses inhaler - last use in 2007  . Gestational diabetes 2016   with 2016 preg, diet controlled, no meds  . H/O varicella   . Headache(784.0)    otc med prn  . Heartburn in pregnancy   . History of kidney stones    passed stone - no surgery required  . 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 Missed ab    x 3 - no surgery required   . Other abnormalities in shape or position of gravid uterus and of neighboring structures, antepartum    prolapse during  pregnancy  . SVD (spontaneous vaginal delivery)    x 3  . Urinary retention 10/03/2011    Past Surgical History:  Procedure Laterality Date  . CARPAL TUNNEL RELEASE     Right  WRIST ONLY  . CESAREAN SECTION N/A 04/29/2014   Procedure: CESAREAN SECTION;  Surgeon: Sharon Seller, DO;  Location: [facility];  Service: Obstetrics;  Laterality: N/A;  . CESAREAN SECTION N/A    Phreesia 01/10/2020  . LITHOTRIPSY     x 1  . WISDOM TOOTH EXTRACTION      Family History  Adopted: Yes  Problem Relation Age of Onset  . Other Neg Hx     Social History   Socioeconomic History  . Marital status: Married    Spouse name: Not on file  . Number of children: 4  . Years of education: Not on file  . Highest education level: Not on file  Occupational History    Employer: [employer]    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  Tobacco Use  . Smoking status: Former Smoker    Packs/day: 0.75    Years: 6.00    Pack years: 4.50    Types: Cigarettes    Quit date: 10/30/2004    Years since quitting: 15.2  . Smokeless tobacco: Never Used  Substance and Sexual Activity  . Alcohol use: No  . Drug use: No  . Sexual activity: Yes    Birth control/protection: None  Comment: pregnant  Other Topics Concern  . Not on file  Social History Narrative  . Not on file   Social Determinants of Health   Financial Resource Strain:   . Difficulty of Paying Living Expenses: Not on file  Food Insecurity:   . Worried About Programme researcher, broadcasting/film/video in the Last Year: Not on file  . Ran Out of Food in the Last Year: Not on file  Transportation Needs:   . Lack of Transportation (Medical): Not on file  . Lack of Transportation (Non-Medical): Not on file  Physical Activity:   . Days of Exercise per Week: Not on file  . Minutes of Exercise per Session: Not on file  Stress:   . Feeling of Stress : Not on file  Social Connections:   . Frequency of Communication with Friends and Family: Not on file  . Frequency of Social Gatherings with  Friends and Family: Not on file  . Attends Religious Services: Not on file  . Active Member of Clubs or Organizations: Not on file  . Attends Banker Meetings: Not on file  . Marital Status: Not on file  Intimate Partner Violence:   . Fear of Current or Ex-Partner: Not on file  . Emotionally Abused: Not on file  . Physically Abused: Not on file  . Sexually Abused: Not on file    ROS Review of Systems  Constitutional: Negative for chills and fever.  HENT: Negative for congestion, rhinorrhea, sinus pressure, sinus pain and sore throat.   Eyes: Negative for pain and discharge.  Respiratory: Negative for cough and shortness of breath.   Cardiovascular: Negative for chest pain and palpitations.  Gastrointestinal: Negative for abdominal pain, constipation, diarrhea, nausea and vomiting.  Endocrine: Negative for polydipsia and polyuria.  Genitourinary: Negative for dysuria and hematuria.  Musculoskeletal: Negative for neck pain and neck stiffness.  Skin: Negative for rash.  Neurological: Negative for dizziness and weakness.  Psychiatric/Behavioral: Negative for agitation and behavioral problems.    Objective:   Today's Vitals: BP 114/71 (BP Location: Right Arm, Patient Position: Sitting, Cuff Size: Normal)   Pulse 68   Temp (!) 97 F (36.1 C) (Temporal)   Resp 18   Ht 5\' 4"  (1.626 m)   Wt 174 lb (78.9 kg)   SpO2 99%   BMI 29.87 kg/m   Physical Exam Vitals reviewed.  Constitutional:      General: She is not in acute distress.    Appearance: She is not diaphoretic.  HENT:     Head: Normocephalic and atraumatic.     Nose: Nose normal.     Mouth/Throat:     Mouth: Mucous membranes are moist.  Eyes:     General: No scleral icterus.    Extraocular Movements: Extraocular movements intact.     Pupils: Pupils are equal, round, and reactive to light.  Cardiovascular:     Rate and Rhythm: Normal rate and regular rhythm.     Pulses: Normal pulses.     Heart sounds:  No murmur heard.   Pulmonary:     Breath sounds: Normal breath sounds. No wheezing or rales.  Abdominal:     Palpations: Abdomen is soft.     Tenderness: There is no abdominal tenderness.  Musculoskeletal:     Cervical back: Neck supple. No tenderness.     Right lower leg: No edema.     Left lower leg: No edema.  Skin:    General: Skin is warm.  Findings: No rash.  Neurological:     General: No focal deficit present.     Mental Status: She is alert and oriented to person, place, and time.     Sensory: No sensory deficit.     Motor: No weakness.  Psychiatric:        Mood and Affect: Mood normal.        Behavior: Behavior normal.      Assessment & Plan:   Problem List Items Addressed This Visit     Encounter to establish care Care established Previous chart reviewed History and medications reviewed with the patient  History of nephrolithiasis H/o multiple episodes of nephrolithiasis, s/p lithotripsy once Occasional flank pain, intermittent Advised to maintain adequate water intake and low-salt diet  Gestational diabetes H/o gestational diabetes HbA1C: 5.4 in the office today Advised for low carbohydrate diet to decrease risk of developing diabetes  Overweight (BMI 25.0-29.9) Diet modification and moderate exercise  Will check CBC, CMP and lipid profile.  Outpatient Encounter Medications as of 01/11/2020  Medication Sig  . [DISCONTINUED] albuterol (PROVENTIL HFA) 108 (90 BASE) MCG/ACT inhaler Inhale 2 puffs into the lungs every 6 (six) hours as needed for wheezing or shortness of breath.  (Patient not taking: Reported on 01/11/2020)  . [DISCONTINUED] calcium carbonate (TUMS - DOSED IN MG ELEMENTAL CALCIUM) 500 MG chewable tablet Chew 2 tablets by mouth 3 (three) times daily as needed for indigestion or heartburn. (Patient not taking: Reported on 01/11/2020)  . [DISCONTINUED] ibuprofen (ADVIL,MOTRIN) 600 MG tablet Take 1 tablet (600 mg total) by mouth every 6 (six)  hours as needed. (Patient not taking: Reported on 01/11/2020)  . [DISCONTINUED] oxyCODONE (ROXICODONE) 5 MG immediate release tablet 1-2 po every 6 hours prn severe pain (Patient not taking: Reported on 01/11/2020)  . [DISCONTINUED] Prenatal Vit-Fe Fumarate-FA (PRENATAL MULTIVITAMIN) TABS Take 1 tablet by mouth at bedtime.  (Patient not taking: Reported on 01/11/2020)  . [DISCONTINUED] ranitidine (ZANTAC) 75 MG tablet Take 75 mg by mouth 2 (two) times daily. (Patient not taking: Reported on 01/11/2020)   No facility-administered encounter medications on file as of 01/11/2020.    Follow-up: Return in about 6 months (around 07/11/2020).   Anabel Halon, MD

## 2020-01-11 NOTE — Assessment & Plan Note (Signed)
H/o multiple episodes of nephrolithiasis, s/p lithotripsy once Occasional flank pain, intermittent Advised to maintain adequate water intake and low-salt diet

## 2020-01-11 NOTE — Assessment & Plan Note (Signed)
Care established Previous chart reviewed History and medications reviewed with the patient 

## 2020-01-11 NOTE — Patient Instructions (Signed)
Please continue to follow healthy diet, consisting of green vegetables and fruits.  Please maintain at least 2 l of fluid/water intake per day.  Try to perform moderate exercise/walking, at least 150 mins/week.  Please get the fasting blood tests done.

## 2020-01-12 LAB — MICROALBUMIN, URINE: Microalb, Ur: <3 ug/mL — ABNORMAL HIGH

## 2020-01-14 DIAGNOSIS — Z131 Encounter for screening for diabetes mellitus: Secondary | ICD-10-CM | POA: Diagnosis not present

## 2020-01-14 DIAGNOSIS — Z7689 Persons encountering health services in other specified circumstances: Secondary | ICD-10-CM | POA: Diagnosis not present

## 2020-01-15 LAB — LIPID PANEL
Chol/HDL Ratio: 4 ratio (ref 0.0–4.4)
Cholesterol, Total: 165 mg/dL (ref 100–199)
HDL: 41 mg/dL (ref >39)
LDL Chol Calc (NIH): 107 mg/dL — ABNORMAL HIGH (ref 0–99)
Triglycerides: 89 mg/dL (ref 0–149)
VLDL Cholesterol Cal: 17 mg/dL (ref 5–40)

## 2020-01-15 LAB — CBC WITH DIFFERENTIAL/PLATELET
Basophils Absolute: 0.1 10*3/uL (ref 0.0–0.2)
Basos: 1 %
EOS (ABSOLUTE): 0.1 10*3/uL (ref 0.0–0.4)
Eos: 2 %
Hematocrit: 41.7 % (ref 34.0–46.6)
Hemoglobin: 13.8 g/dL (ref 11.1–15.9)
Immature Grans (Abs): 0 10*3/uL (ref 0.0–0.1)
Immature Granulocytes: 0 %
Lymphocytes Absolute: 2.4 10*3/uL (ref 0.7–3.1)
Lymphs: 32 %
MCH: 30.3 pg (ref 26.6–33.0)
MCHC: 33.1 g/dL (ref 31.5–35.7)
MCV: 92 fL (ref 79–97)
Monocytes Absolute: 0.6 10*3/uL (ref 0.1–0.9)
Monocytes: 8 %
Neutrophils Absolute: 4.3 10*3/uL (ref 1.4–7.0)
Neutrophils: 57 %
Platelets: 361 10*3/uL (ref 150–450)
RBC: 4.55 x10E6/uL (ref 3.77–5.28)
RDW: 12.4 % (ref 11.7–15.4)
WBC: 7.6 10*3/uL (ref 3.4–10.8)

## 2020-01-15 LAB — URINALYSIS, ROUTINE W REFLEX MICROSCOPIC
Bilirubin, UA: NEGATIVE
Glucose, UA: NEGATIVE
Ketones, UA: NEGATIVE
Nitrite, UA: NEGATIVE
Protein,UA: NEGATIVE
RBC, UA: NEGATIVE
Specific Gravity, UA: 1.007 (ref 1.005–1.030)
Urobilinogen, Ur: 0.2 mg/dL (ref 0.2–1.0)
pH, UA: 7 (ref 5.0–7.5)

## 2020-01-15 LAB — CMP14+EGFR
ALT: 14 IU/L (ref 0–32)
AST: 15 IU/L (ref 0–40)
Albumin/Globulin Ratio: 1.6 (ref 1.2–2.2)
Albumin: 4.5 g/dL (ref 3.8–4.8)
Alkaline Phosphatase: 81 IU/L (ref 44–121)
BUN/Creatinine Ratio: 14 (ref 9–23)
BUN: 11 mg/dL (ref 6–20)
Bilirubin Total: 0.7 mg/dL (ref 0.0–1.2)
CO2: 21 mmol/L (ref 20–29)
Calcium: 9.5 mg/dL (ref 8.7–10.2)
Chloride: 100 mmol/L (ref 96–106)
Creatinine, Ser: 0.76 mg/dL (ref 0.57–1.00)
GFR calc Af Amer: 114 mL/min/{1.73_m2} (ref >59)
GFR calc non Af Amer: 99 mL/min/{1.73_m2} (ref >59)
Globulin, Total: 2.9 g/dL (ref 1.5–4.5)
Glucose: 87 mg/dL (ref 65–99)
Potassium: 4.2 mmol/L (ref 3.5–5.2)
Sodium: 135 mmol/L (ref 134–144)
Total Protein: 7.4 g/dL (ref 6.0–8.5)

## 2020-01-15 LAB — MICROSCOPIC EXAMINATION
Bacteria, UA: NONE SEEN
Casts: NONE SEEN /lpf
Epithelial Cells (non renal): NONE SEEN /hpf (ref 0–10)
RBC: NONE SEEN /hpf (ref 0–2)
WBC, UA: NONE SEEN /hpf (ref 0–5)

## 2020-01-15 LAB — T4 AND TSH
T4, Total: 6.9 ug/dL (ref 4.5–12.0)
TSH: 1.89 u[IU]/mL (ref 0.450–4.500)

## 2020-01-15 LAB — HEPATITIS C ANTIBODY: Hep C Virus Ab: <0.1 s/co ratio (ref 0.0–0.9)

## 2020-03-14 LAB — HM DIABETES EYE EXAM

## 2020-07-11 ENCOUNTER — Ambulatory Visit: Payer: 59 | Admitting: Internal Medicine

## 2020-07-13 ENCOUNTER — Ambulatory Visit (INDEPENDENT_AMBULATORY_CARE_PROVIDER_SITE_OTHER): Payer: 59 | Admitting: Internal Medicine

## 2020-07-13 ENCOUNTER — Encounter: Payer: Self-pay | Admitting: Internal Medicine

## 2020-07-13 ENCOUNTER — Other Ambulatory Visit: Payer: Self-pay

## 2020-07-13 VITALS — BP 113/74 | HR 79 | Temp 98.7°F | Resp 18 | Ht 64.0 in | Wt 184.4 lb

## 2020-07-13 DIAGNOSIS — O34539 Maternal care for retroversion of gravid uterus, unspecified trimester: Secondary | ICD-10-CM | POA: Insufficient documentation

## 2020-07-13 DIAGNOSIS — Z87442 Personal history of urinary calculi: Secondary | ICD-10-CM

## 2020-07-13 DIAGNOSIS — N814 Uterovaginal prolapse, unspecified: Secondary | ICD-10-CM | POA: Insufficient documentation

## 2020-07-13 DIAGNOSIS — N941 Unspecified dyspareunia: Secondary | ICD-10-CM | POA: Insufficient documentation

## 2020-07-13 DIAGNOSIS — R87810 Cervical high risk human papillomavirus (HPV) DNA test positive: Secondary | ICD-10-CM | POA: Insufficient documentation

## 2020-07-13 DIAGNOSIS — N761 Subacute and chronic vaginitis: Secondary | ICD-10-CM | POA: Insufficient documentation

## 2020-07-13 DIAGNOSIS — N393 Stress incontinence (female) (male): Secondary | ICD-10-CM | POA: Insufficient documentation

## 2020-07-13 DIAGNOSIS — E782 Mixed hyperlipidemia: Secondary | ICD-10-CM

## 2020-07-13 DIAGNOSIS — K219 Gastro-esophageal reflux disease without esophagitis: Secondary | ICD-10-CM | POA: Insufficient documentation

## 2020-07-13 NOTE — Assessment & Plan Note (Signed)
DASH diet advised Check lipid profile

## 2020-07-13 NOTE — Assessment & Plan Note (Signed)
H/o multiple episodes of nephrolithiasis, s/p lithotripsy once Advised to maintain adequate water intake and low-salt diet

## 2020-07-13 NOTE — Progress Notes (Signed)
Established Patient Office Visit  Subjective:  Patient ID: Tiffany Clark, female    DOB: 15-Nov-1980  Age: 40 y.o. MRN: [id number]  CC:  Chief Complaint  Patient presents with  . Follow-up    6 month follow up     HPI Tiffany Clark is a 40 year old female with past medical history of nephrolithiasis and gestational diabetes presents for follow up of her medical conditions.  She has been doing well overall.  She denies any flank pain currently. No reports of dysuria or hematuria.  Blood tests were reviewed and discussed with the patient.  Past Medical History:  Diagnosis Date  . Abnormal Pap smear   . Asthma    rarely uses inhaler - last use in 2007  . Gestational diabetes 2016   with 2016 preg, diet controlled, no meds  . H/O varicella   . Headache(784.0)    otc med prn  . Heartburn in pregnancy   . History of kidney stones    passed stone - no surgery required  . 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 Intrauterine normal pregnancy 04/29/2014  . Missed ab    x 3 - no surgery required   . Other abnormalities in shape or position of gravid uterus and of neighboring structures, antepartum    prolapse during pregnancy  . SVD (spontaneous vaginal delivery)    x 3  . Threatened abortion in first trimester 07/24/2013  . Urinary retention 10/03/2011    Past Surgical History:  Procedure Laterality Date  . CARPAL TUNNEL RELEASE     Right  WRIST ONLY  . CESAREAN SECTION N/A 04/29/2014   Procedure: CESAREAN SECTION;  Surgeon: Sharon Seller, DO;  Location: [facility];  Service: Obstetrics;  Laterality: N/A;  . CESAREAN SECTION N/A    Phreesia 01/10/2020  . LITHOTRIPSY     x 1  . WISDOM TOOTH EXTRACTION      Family History  Adopted: Yes  Problem Relation Age of Onset  . Other Neg Hx     Social History   Socioeconomic History  . Marital status: Married    Spouse name: Not on file  . Number of children: 4  . Years of  education: Not on file  . Highest education level: Not on file  Occupational History    Employer: [employer]    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  Tobacco Use  . Smoking status: Former Smoker    Packs/day: 0.75    Years: 6.00    Pack years: 4.50    Types: Cigarettes    Quit date: 10/30/2004    Years since quitting: 15.7  . Smokeless tobacco: Never Used  Substance and Sexual Activity  . Alcohol use: No  . Drug use: No  . Sexual activity: Yes    Birth control/protection: None    Comment: pregnant  Other Topics Concern  . Not on file  Social History Narrative  . Not on file   Social Determinants of Health   Financial Resource Strain: Not on file  Food Insecurity: Not on file  Transportation Needs: Not on file  Physical Activity: Not on file  Stress: Not on file  Social Connections: Not on file  Intimate Partner Violence: Not on file    Outpatient Medications Prior to Visit  Medication Sig Dispense Refill  . levonorgestrel (MIRENA, 52 MG,) 20 MCG/24HR IUD      No facility-administered medications prior to visit.    Allergies  Allergen Reactions  . Aleve [Naproxen Sodium]     Heart races and sweaty palms. Feels like panic attack. Can take Ibuprofen  . Hydrocodone Nausea And Vomiting  . Naproxen     Other reaction(s): Unknown  . Latex Rash    Other reaction(s): Unknown    ROS Review of Systems  Constitutional: Negative for chills and fever.  Respiratory: Negative for cough and shortness of breath.   Cardiovascular: Negative for chest pain and palpitations.  Genitourinary: Negative for dysuria and hematuria.  Musculoskeletal: Negative for neck pain and neck stiffness.  Skin: Negative for rash.      Objective:    Physical Exam Vitals reviewed.  Constitutional:      General: She is not in acute distress.    Appearance: She is not diaphoretic.  HENT:     Head: Normocephalic and atraumatic.  Eyes:     General: No scleral icterus.    Extraocular Movements:  Extraocular movements intact.  Cardiovascular:     Rate and Rhythm: Normal rate and regular rhythm.     Pulses: Normal pulses.     Heart sounds: Normal heart sounds. No murmur heard.   Pulmonary:     Breath sounds: Normal breath sounds. No wheezing or rales.  Musculoskeletal:     Cervical back: Neck supple. No tenderness.  Skin:    General: Skin is warm.     Findings: No rash.  Neurological:     General: No focal deficit present.     Mental Status: She is alert and oriented to person, place, and time.  Psychiatric:        Mood and Affect: Mood normal.        Behavior: Behavior normal.     BP 113/74 (BP Location: Right Arm, Patient Position: Sitting, Cuff Size: Normal)   Pulse 79   Temp 98.7 F (37.1 C) (Oral)   Resp 18   Ht 5\' 4"  (1.626 m)   Wt 184 lb 6.4 oz (83.6 kg)   SpO2 97%   BMI 31.65 kg/m  Wt Readings from Last 3 Encounters:  07/13/20 184 lb 6.4 oz (83.6 kg)  01/11/20 174 lb (78.9 kg)  04/29/14 194 lb (88 kg)     Health Maintenance Due  Topic Date Due  . HEMOGLOBIN A1C  07/11/2020    There are no preventive care reminders to display for this patient.  Lab Results  Component Value Date   TSH 1.890 01/14/2020   Lab Results  Component Value Date   WBC 7.6 01/14/2020   HGB 13.8 01/14/2020   HCT 41.7 01/14/2020   MCV 92 01/14/2020   PLT 361 01/14/2020   Lab Results  Component Value Date   NA 135 01/14/2020   K 4.2 01/14/2020   CO2 21 01/14/2020   GLUCOSE 87 01/14/2020   BUN 11 01/14/2020   CREATININE 0.76 01/14/2020   BILITOT 0.7 01/14/2020   ALKPHOS 81 01/14/2020   AST 15 01/14/2020   ALT 14 01/14/2020   PROT 7.4 01/14/2020   ALBUMIN 4.5 01/14/2020   CALCIUM 9.5 01/14/2020   ANIONGAP 5 04/27/2014   Lab Results  Component Value Date   CHOL 165 01/14/2020   Lab Results  Component Value Date   HDL 41 01/14/2020   Lab Results  Component Value Date   LDLCALC 107 (H) 01/14/2020   Lab Results  Component Value Date   TRIG 89  01/14/2020   Lab Results  Component Value Date   CHOLHDL 4.0 01/14/2020  Lab Results  Component Value Date   HGBA1C 5.4 01/11/2020   HGBA1C 5.4 01/11/2020   HGBA1C 5.4 (A) 01/11/2020   HGBA1C 5.4 01/11/2020      Assessment & Plan:   Problem List Items Addressed This Visit      Other   History of nephrolithiasis    H/o multiple episodes of nephrolithiasis, s/p lithotripsy once Advised to maintain adequate water intake and low-salt diet      Mixed hyperlipidemia - Primary    DASH diet advised Check lipid profile      Relevant Orders   Lipid Profile      No orders of the defined types were placed in this encounter.   Follow-up: Return in about 1 year (around 07/13/2021).    Anabel Halon, MD

## 2020-07-13 NOTE — Patient Instructions (Addendum)
Please continue to follow low cholesterol diet and perform moderate exercise/walking at least 150 mins/week.   [email or web address]">  DASH Eating Plan DASH stands for Dietary Approaches to Stop Hypertension. The DASH eating plan is a healthy eating plan that has been shown to:  Reduce high blood pressure (hypertension).  Reduce your risk for type 2 diabetes, heart disease, and stroke.  Help with weight loss. What are tips for following this plan? Reading food labels  Check food labels for the amount of salt (sodium) per serving. Choose foods with less than 5 percent of the Daily Value of sodium. Generally, foods with less than 300 milligrams (mg) of sodium per serving fit into this eating plan.  To find whole grains, look for the word "whole" as the first word in the ingredient list. Shopping  Buy products labeled as "low-sodium" or "no salt added."  Buy fresh foods. Avoid canned foods and pre-made or frozen meals. Cooking  Avoid adding salt when cooking. Use salt-free seasonings or herbs instead of table salt or sea salt. Check with your health care provider or pharmacist before using salt substitutes.  Do not fry foods. Cook foods using healthy methods such as baking, boiling, grilling, roasting, and broiling instead.  Cook with heart-healthy oils, such as olive, canola, avocado, soybean, or sunflower oil. Meal planning  Eat a balanced diet that includes: ? 4 or more servings of fruits and 4 or more servings of vegetables each day. Try to fill one-half of your plate with fruits and vegetables. ? 6-8 servings of whole grains each day. ? Less than 6 oz (170 g) of lean meat, poultry, or fish each day. A 3-oz (85-g) serving of meat is about the same size as a deck of cards. One egg equals 1 oz (28 g). ? 2-3 servings of low-fat dairy each day. One serving is 1 cup (237 mL). ? 1 serving of nuts, seeds, or beans 5 times each week. ? 2-3  servings of heart-healthy fats. Healthy fats called omega-3 fatty acids are found in foods such as walnuts, flaxseeds, fortified milks, and eggs. These fats are also found in cold-water fish, such as sardines, salmon, and mackerel.  Limit how much you eat of: ? Canned or prepackaged foods. ? Food that is high in trans fat, such as some fried foods. ? Food that is high in saturated fat, such as fatty meat. ? Desserts and other sweets, sugary drinks, and other foods with added sugar. ? Full-fat dairy products.  Do not salt foods before eating.  Do not eat more than 4 egg yolks a week.  Try to eat at least 2 vegetarian meals a week.  Eat more home-cooked food and less restaurant, buffet, and fast food.   Lifestyle  When eating at a restaurant, ask that your food be prepared with less salt or no salt, if possible.  If you drink alcohol: ? Limit how much you use to:  0-1 drink a day for women who are not pregnant.  0-2 drinks a day for men. ? Be aware of how much alcohol is in your drink. In the [place] one drink equals one 12 oz bottle of beer (355 mL), one 5 oz glass of wine (148 mL), or one 1 oz glass of hard liquor (44 mL). General information  Avoid eating more than 2,300 mg of salt a day. If you have hypertension, you may need to reduce your sodium intake to 1,500 mg a day.  Work with your health care provider  to maintain a healthy body weight or to lose weight. Ask what an ideal weight is for you.  Get at least 30 minutes of exercise that causes your heart to beat faster (aerobic exercise) most days of the week. Activities may include walking, swimming, or biking.  Work with your health care provider or dietitian to adjust your eating plan to your individual calorie needs. What foods should I eat? Fruits All fresh, dried, or frozen fruit. Canned fruit in natural juice (without added sugar). Vegetables Fresh or frozen vegetables (raw, steamed, roasted, or grilled). Low-sodium  or reduced-sodium tomato and vegetable juice. Low-sodium or reduced-sodium tomato sauce and tomato paste. Low-sodium or reduced-sodium canned vegetables. Grains Whole-grain or whole-wheat bread. Whole-grain or whole-wheat pasta. Brown rice. Orpah Cobb. Bulgur. Whole-grain and low-sodium cereals. Pita bread. Low-fat, low-sodium crackers. Whole-wheat flour tortillas. Meats and other proteins Skinless chicken or [place]. Ground chicken or [place]. Pork with fat trimmed off. Fish and seafood. Egg whites. Dried beans, peas, or lentils. Unsalted nuts, nut butters, and seeds. Unsalted canned beans. Lean cuts of beef with fat trimmed off. Low-sodium, lean precooked or cured meat, such as sausages or meat loaves. Dairy Low-fat (1%) or fat-free (skim) milk. Reduced-fat, low-fat, or fat-free cheeses. Nonfat, low-sodium ricotta or cottage cheese. Low-fat or nonfat yogurt. Low-fat, low-sodium cheese. Fats and oils Soft margarine without trans fats. Vegetable oil. Reduced-fat, low-fat, or light mayonnaise and salad dressings (reduced-sodium). Canola, safflower, olive, avocado, soybean, and sunflower oils. Avocado. Seasonings and condiments Herbs. Spices. Seasoning mixes without salt. Other foods Unsalted popcorn and pretzels. Fat-free sweets. The items listed above may not be a complete list of foods and beverages you can eat. Contact a dietitian for more information. What foods should I avoid? Fruits Canned fruit in a light or heavy syrup. Fried fruit. Fruit in cream or butter sauce. Vegetables Creamed or fried vegetables. Vegetables in a cheese sauce. Regular canned vegetables (not low-sodium or reduced-sodium). Regular canned tomato sauce and paste (not low-sodium or reduced-sodium). Regular tomato and vegetable juice (not low-sodium or reduced-sodium). Rosita Fire. Olives. Grains Baked goods made with fat, such as croissants, muffins, or some breads. Dry pasta or rice meal packs. Meats and other  proteins Fatty cuts of meat. Ribs. Fried meat. Tomasa Blase. Bologna, salami, and other precooked or cured meats, such as sausages or meat loaves. Fat from the back of a pig (fatback). Bratwurst. Salted nuts and seeds. Canned beans with added salt. Canned or smoked fish. Whole eggs or egg yolks. Chicken or [place] with skin. Dairy Whole or 2% milk, cream, and half-and-half. Whole or full-fat cream cheese. Whole-fat or sweetened yogurt. Full-fat cheese. Nondairy creamers. Whipped toppings. Processed cheese and cheese spreads. Fats and oils Butter. Stick margarine. Lard. Shortening. Ghee. Bacon fat. Tropical oils, such as coconut, palm kernel, or palm oil. Seasonings and condiments Onion salt, garlic salt, seasoned salt, table salt, and sea salt. Worcestershire sauce. Tartar sauce. Barbecue sauce. Teriyaki sauce. Soy sauce, including reduced-sodium. Steak sauce. Canned and packaged gravies. Fish sauce. Oyster sauce. Cocktail sauce. Store-bought horseradish. Ketchup. Mustard. Meat flavorings and tenderizers. Bouillon cubes. Hot sauces. Pre-made or packaged marinades. Pre-made or packaged taco seasonings. Relishes. Regular salad dressings. Other foods Salted popcorn and pretzels. The items listed above may not be a complete list of foods and beverages you should avoid. Contact a dietitian for more information. Where to find more information  National Heart, Lung, and Blood Institute: PopSteam.is  American Heart Association: www.heart.org  Academy of Nutrition and Dietetics: www.eatright.org  National Kidney Foundation:  www.kidney.org Summary  The DASH eating plan is a healthy eating plan that has been shown to reduce high blood pressure (hypertension). It may also reduce your risk for type 2 diabetes, heart disease, and stroke.  When on the DASH eating plan, aim to eat more fresh fruits and vegetables, whole grains, lean proteins, low-fat dairy, and heart-healthy fats.  With the DASH eating plan,  you should limit salt (sodium) intake to 2,300 mg a day. If you have hypertension, you may need to reduce your sodium intake to 1,500 mg a day.  Work with your health care provider or dietitian to adjust your eating plan to your individual calorie needs. This information is not intended to replace advice given to you by your health care provider. Make sure you discuss any questions you have with your health care provider. Document Revised: 02/19/2019 Document Reviewed: 02/19/2019 Elsevier Patient Education  2021 [employer].

## 2020-07-19 ENCOUNTER — Encounter: Payer: Self-pay | Admitting: *Deleted

## 2020-07-21 ENCOUNTER — Other Ambulatory Visit: Payer: Self-pay

## 2020-07-21 ENCOUNTER — Ambulatory Visit (HOSPITAL_COMMUNITY)
Admission: RE | Admit: 2020-07-21 | Discharge: 2020-07-21 | Disposition: A | Discharge status: Home / Self Care | Payer: 59 | Source: Ambulatory Visit | Attending: Obstetrics and Gynecology | Admitting: Obstetrics and Gynecology

## 2020-07-21 ENCOUNTER — Other Ambulatory Visit (HOSPITAL_COMMUNITY): Payer: Self-pay | Admitting: Obstetrics and Gynecology

## 2020-07-21 DIAGNOSIS — Z1231 Encounter for screening mammogram for malignant neoplasm of breast: Secondary | ICD-10-CM | POA: Insufficient documentation

## 2020-12-14 DIAGNOSIS — L309 Dermatitis, unspecified: Secondary | ICD-10-CM | POA: Diagnosis not present

## 2020-12-14 DIAGNOSIS — Z7689 Persons encountering health services in other specified circumstances: Secondary | ICD-10-CM | POA: Diagnosis not present

## 2020-12-14 DIAGNOSIS — Z8742 Personal history of other diseases of the female genital tract: Secondary | ICD-10-CM | POA: Diagnosis not present

## 2020-12-14 DIAGNOSIS — Z01419 Encounter for gynecological examination (general) (routine) without abnormal findings: Secondary | ICD-10-CM | POA: Diagnosis not present

## 2020-12-14 DIAGNOSIS — R87610 Atypical squamous cells of undetermined significance on cytologic smear of cervix (ASC-US): Secondary | ICD-10-CM | POA: Diagnosis not present

## 2020-12-14 DIAGNOSIS — Z30431 Encounter for routine checking of intrauterine contraceptive device: Secondary | ICD-10-CM | POA: Diagnosis not present

## 2021-07-13 ENCOUNTER — Ambulatory Visit (INDEPENDENT_AMBULATORY_CARE_PROVIDER_SITE_OTHER): Payer: 59 | Admitting: Internal Medicine

## 2021-07-13 ENCOUNTER — Encounter: Payer: Self-pay | Admitting: Internal Medicine

## 2021-07-13 VITALS — BP 106/65 | HR 69 | Ht 64.0 in | Wt 172.4 lb

## 2021-07-13 DIAGNOSIS — E782 Mixed hyperlipidemia: Secondary | ICD-10-CM

## 2021-07-13 DIAGNOSIS — R87610 Atypical squamous cells of undetermined significance on cytologic smear of cervix (ASC-US): Secondary | ICD-10-CM | POA: Diagnosis not present

## 2021-07-13 DIAGNOSIS — E559 Vitamin D deficiency, unspecified: Secondary | ICD-10-CM

## 2021-07-13 DIAGNOSIS — Z0001 Encounter for general adult medical examination with abnormal findings: Secondary | ICD-10-CM | POA: Insufficient documentation

## 2021-07-13 NOTE — Assessment & Plan Note (Signed)
DASH diet advised Check lipid profile 

## 2021-07-13 NOTE — Assessment & Plan Note (Signed)
Physical exam as documented. ?Fasting blood tests ordered. ?PAP smear review to OB/GYN. ?

## 2021-07-13 NOTE — Patient Instructions (Addendum)
Please continue to follow heart healthy diet and perform moderate exercise/walking at least 150 mins/week. ? ?Please get fasting blood tests done within a week. ?

## 2021-07-13 NOTE — Progress Notes (Signed)
? ?Established Patient Office Visit ? ?Subjective:  ?Patient ID: Tiffany Clark, female    DOB: 06/16/1980  Age: 41 y.o. MRN: [id number] ? ?CC:  ?Chief Complaint  ?Patient presents with  ? Follow-up  ?  1 year follow up  ? ? ?HPI ?Tiffany Clark is a 41 y.o. female with past medical history of nephrolithiasis and gestational diabetes who presents for annual physical. ? ?She has been doing well overall.  She has joined [employer] now, and has lost about 20 pounds with it. ? ?She had Pap smear with her OB/GYN, which showed ASCUS with negative high-risk HPV test.  She denies any abnormal uterine bleeding, vaginal discharge or itching currently. ? ?Past Medical History:  ?Diagnosis Date  ? Abnormal Pap smear   ? Asthma   ? rarely uses inhaler - last use in 2007  ? Gestational diabetes 2016  ? with 2016 preg, diet controlled, no meds  ? H/O varicella   ? Headache(784.0)   ? otc med prn  ? Heartburn in pregnancy   ? History of kidney stones   ? passed stone - no surgery required  ? 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 Intrauterine normal pregnancy 04/29/2014  ? Missed ab   ? x 3 - no surgery required   ? Other abnormalities in shape or position of gravid uterus and of neighboring structures, antepartum   ? prolapse during pregnancy  ? SVD (spontaneous vaginal delivery)   ? x 3  ? Threatened abortion in first trimester 07/24/2013  ? Urinary retention 10/03/2011  ? ? ?Past Surgical History:  ?Procedure Laterality Date  ? CARPAL TUNNEL RELEASE    ? Right  WRIST ONLY  ? CESAREAN SECTION N/A 04/29/2014  ? Procedure: CESAREAN SECTION;  Surgeon: Annalee Genta, DO;  Location: [facility];  Service: Obstetrics;  Laterality: N/A;  ? CESAREAN SECTION N/A   ? [employer] 01/10/2020  ? LITHOTRIPSY    ? x 1  ? WISDOM TOOTH EXTRACTION    ? ? ?Family History  ?Adopted: Yes  ?Problem Relation Age of Onset  ? Other Neg Hx   ? ? ?Social History  ? ?Socioeconomic History  ? Marital status: Married  ?   Spouse name: Not on file  ? Number of children: 4  ? Years of education: Not on file  ? Highest education level: Not on file  ?Occupational History  ?  Employer: [employer]  ?  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  ?Tobacco Use  ? Smoking status: Former  ?  Packs/day: 0.75  ?  Years: 6.00  ?  Pack years: 4.50  ?  Types: Cigarettes  ?  Quit date: 10/30/2004  ?  Years since quitting: 16.7  ? Smokeless tobacco: Never  ?Substance and Sexual Activity  ? Alcohol use: No  ? Drug use: No  ? Sexual activity: Yes  ?  Birth control/protection: None  ?  Comment: pregnant  ?Other Topics Concern  ? Not on file  ?Social History Narrative  ? Not on file  ? ?Social Determinants of Health  ? ?Financial Resource Strain: Not on file  ?Food Insecurity: Not on file  ?Transportation Needs: Not on file  ?Physical Activity: Not on file  ?Stress: Not on file  ?Social Connections: Not on file  ?Intimate Partner Violence: Not on file  ? ? ?Outpatient Medications Prior to Visit  ?Medication Sig Dispense Refill  ? levonorgestrel (MIRENA, 52 MG,) 20 MCG/24HR IUD     ? ?  No facility-administered medications prior to visit.  ? ? ?Allergies  ?Allergen Reactions  ? Aleve [Naproxen Sodium]   ?  Heart races and sweaty palms. Feels like panic attack. ?Can take Ibuprofen  ? Hydrocodone Nausea And Vomiting  ? Naproxen   ?  Other reaction(s): Unknown  ? Latex Rash  ?  Other reaction(s): Unknown  ? ? ?ROS ?Review of Systems  ?Constitutional:  Negative for chills and fever.  ?HENT:  Negative for congestion, sinus pressure, sinus pain and sore throat.   ?Eyes:  Negative for pain and discharge.  ?Respiratory:  Negative for cough and shortness of breath.   ?Cardiovascular:  Negative for chest pain and palpitations.  ?Gastrointestinal:  Negative for abdominal pain, constipation, diarrhea, nausea and vomiting.  ?Endocrine: Negative for polydipsia and polyuria.  ?Genitourinary:  Negative for dysuria and hematuria.  ?Musculoskeletal:  Negative for neck pain and neck stiffness.   ?Skin:  Negative for rash.  ?Neurological:  Negative for dizziness and weakness.  ?Psychiatric/Behavioral:  Negative for agitation and behavioral problems.   ? ?  ?Objective:  ?  ?Physical Exam ?Vitals reviewed.  ?Constitutional:   ?   General: She is not in acute distress. ?   Appearance: She is not diaphoretic.  ?HENT:  ?   Head: Normocephalic and atraumatic.  ?Eyes:  ?   General: No scleral icterus. ?   Extraocular Movements: Extraocular movements intact.  ?Cardiovascular:  ?   Rate and Rhythm: Normal rate and regular rhythm.  ?   Pulses: Normal pulses.  ?   Heart sounds: Normal heart sounds. No murmur heard. ?Pulmonary:  ?   Breath sounds: Normal breath sounds. No wheezing or rales.  ?Abdominal:  ?   Palpations: Abdomen is soft.  ?   Tenderness: There is no abdominal tenderness.  ?Musculoskeletal:  ?   Cervical back: Neck supple. No tenderness.  ?   Right lower leg: No edema.  ?   Left lower leg: No edema.  ?Skin: ?   General: Skin is warm.  ?   Findings: No rash.  ?Neurological:  ?   General: No focal deficit present.  ?   Mental Status: She is alert and oriented to person, place, and time.  ?   Cranial Nerves: No cranial nerve deficit.  ?   Sensory: No sensory deficit.  ?   Motor: No weakness.  ?Psychiatric:     ?   Mood and Affect: Mood normal.     ?   Behavior: Behavior normal.  ? ? ?BP 106/65   Pulse 69   Ht 5' 4"  (1.626 m)   Wt 172 lb 6.4 oz (78.2 kg)   SpO2 96%   BMI 29.59 kg/m?  ?Wt Readings from Last 3 Encounters:  ?07/13/21 172 lb 6.4 oz (78.2 kg)  ?07/13/20 184 lb 6.4 oz (83.6 kg)  ?01/11/20 174 lb (78.9 kg)  ? ? ?Lab Results  ?Component Value Date  ? TSH 1.890 01/14/2020  ? ?Lab Results  ?Component Value Date  ? WBC 7.6 01/14/2020  ? HGB 13.8 01/14/2020  ? HCT 41.7 01/14/2020  ? MCV 92 01/14/2020  ? PLT 361 01/14/2020  ? ?Lab Results  ?Component Value Date  ? NA 135 01/14/2020  ? K 4.2 01/14/2020  ? CO2 21 01/14/2020  ? GLUCOSE 87 01/14/2020  ? BUN 11 01/14/2020  ? CREATININE 0.76 01/14/2020  ?  BILITOT 0.7 01/14/2020  ? ALKPHOS 81 01/14/2020  ? AST 15 01/14/2020  ? ALT 14 01/14/2020  ?  PROT 7.4 01/14/2020  ? ALBUMIN 4.5 01/14/2020  ? CALCIUM 9.5 01/14/2020  ? ANIONGAP 5 04/27/2014  ? ?Lab Results  ?Component Value Date  ? CHOL 165 01/14/2020  ? ?Lab Results  ?Component Value Date  ? HDL 41 01/14/2020  ? ?Lab Results  ?Component Value Date  ? LDLCALC 107 (H) 01/14/2020  ? ?Lab Results  ?Component Value Date  ? TRIG 89 01/14/2020  ? ?Lab Results  ?Component Value Date  ? CHOLHDL 4.0 01/14/2020  ? ?Lab Results  ?Component Value Date  ? HGBA1C 5.4 01/11/2020  ? HGBA1C 5.4 01/11/2020  ? HGBA1C 5.4 (A) 01/11/2020  ? HGBA1C 5.4 01/11/2020  ? ? ?  ?Assessment & Plan:  ? ?Problem List Items Addressed This Visit   ? ?  ? Other  ? Mixed hyperlipidemia  ?  DASH diet advised ?Check lipid profile ?  ?  ? Relevant Orders  ? Lipid panel  ? Encounter for general adult medical examination with abnormal findings - Primary  ?  Physical exam as documented. ?Fasting blood tests ordered. ?PAP smear review to OB/GYN. ?  ?  ? Relevant Orders  ? VITAMIN D 25 Hydroxy (Vit-D Deficiency, Fractures)  ? TSH  ? Lipid panel  ? Hemoglobin A1c  ? CMP14+EGFR  ? CBC with Differential/Platelet  ? ?Other Visit Diagnoses   ? ? Vitamin D deficiency      ? Relevant Orders  ? VITAMIN D 25 Hydroxy (Vit-D Deficiency, Fractures)  ? ASCUS of cervix with negative high risk HPV      ? ?  ? ? ?No orders of the defined types were placed in this encounter. ? ? ?Follow-up: Return in about 1 year (around 07/14/2022).  ? ? ?Lindell Spar, MD ?

## 2021-07-20 ENCOUNTER — Other Ambulatory Visit (HOSPITAL_COMMUNITY): Payer: Self-pay | Admitting: Internal Medicine

## 2021-07-20 DIAGNOSIS — Z1231 Encounter for screening mammogram for malignant neoplasm of breast: Secondary | ICD-10-CM

## 2021-07-20 DIAGNOSIS — E782 Mixed hyperlipidemia: Secondary | ICD-10-CM | POA: Diagnosis not present

## 2021-07-20 DIAGNOSIS — Z0001 Encounter for general adult medical examination with abnormal findings: Secondary | ICD-10-CM | POA: Diagnosis not present

## 2021-07-20 DIAGNOSIS — E559 Vitamin D deficiency, unspecified: Secondary | ICD-10-CM | POA: Diagnosis not present

## 2021-07-21 LAB — CMP14+EGFR
ALT: 14 IU/L (ref 0–32)
AST: 15 IU/L (ref 0–40)
Albumin/Globulin Ratio: 1.5 (ref 1.2–2.2)
Albumin: 4.4 g/dL (ref 3.8–4.8)
Alkaline Phosphatase: 76 IU/L (ref 44–121)
BUN/Creatinine Ratio: 18 (ref 9–23)
BUN: 15 mg/dL (ref 6–24)
Bilirubin Total: 0.5 mg/dL (ref 0.0–1.2)
CO2: 19 mmol/L — ABNORMAL LOW (ref 20–29)
Calcium: 9.6 mg/dL (ref 8.7–10.2)
Chloride: 106 mmol/L (ref 96–106)
Creatinine, Ser: 0.83 mg/dL (ref 0.57–1.00)
Globulin, Total: 2.9 g/dL (ref 1.5–4.5)
Glucose: 98 mg/dL (ref 70–99)
Potassium: 4.5 mmol/L (ref 3.5–5.2)
Sodium: 141 mmol/L (ref 134–144)
Total Protein: 7.3 g/dL (ref 6.0–8.5)
eGFR: 91 mL/min/{1.73_m2} (ref >59)

## 2021-07-21 LAB — CBC WITH DIFFERENTIAL/PLATELET
Basophils Absolute: 0 10*3/uL (ref 0.0–0.2)
Basos: 1 %
EOS (ABSOLUTE): 0.1 10*3/uL (ref 0.0–0.4)
Eos: 1 %
Hematocrit: 40.8 % (ref 34.0–46.6)
Hemoglobin: 13.5 g/dL (ref 11.1–15.9)
Immature Grans (Abs): 0 10*3/uL (ref 0.0–0.1)
Immature Granulocytes: 0 %
Lymphocytes Absolute: 1.5 10*3/uL (ref 0.7–3.1)
Lymphs: 22 %
MCH: 30.3 pg (ref 26.6–33.0)
MCHC: 33.1 g/dL (ref 31.5–35.7)
MCV: 92 fL (ref 79–97)
Monocytes Absolute: 0.5 10*3/uL (ref 0.1–0.9)
Monocytes: 8 %
Neutrophils Absolute: 4.5 10*3/uL (ref 1.4–7.0)
Neutrophils: 68 %
Platelets: 345 10*3/uL (ref 150–450)
RBC: 4.45 x10E6/uL (ref 3.77–5.28)
RDW: 12.5 % (ref 11.7–15.4)
WBC: 6.6 10*3/uL (ref 3.4–10.8)

## 2021-07-21 LAB — VITAMIN D 25 HYDROXY (VIT D DEFICIENCY, FRACTURES): Vit D, 25-Hydroxy: 24.8 ng/mL — ABNORMAL LOW (ref 30.0–100.0)

## 2021-07-21 LAB — LIPID PANEL
Chol/HDL Ratio: 4.4 ratio (ref 0.0–4.4)
Cholesterol, Total: 157 mg/dL (ref 100–199)
HDL: 36 mg/dL — ABNORMAL LOW (ref >39)
LDL Chol Calc (NIH): 109 mg/dL — ABNORMAL HIGH (ref 0–99)
Triglycerides: 59 mg/dL (ref 0–149)
VLDL Cholesterol Cal: 12 mg/dL (ref 5–40)

## 2021-07-21 LAB — HEMOGLOBIN A1C
Est. average glucose Bld gHb Est-mCnc: 117 mg/dL
Hgb A1c MFr Bld: 5.7 % — ABNORMAL HIGH (ref 4.8–5.6)

## 2021-07-21 LAB — TSH: TSH: 1.36 u[IU]/mL (ref 0.450–4.500)

## 2021-07-27 ENCOUNTER — Ambulatory Visit (HOSPITAL_COMMUNITY)
Admission: RE | Admit: 2021-07-27 | Discharge: 2021-07-27 | Disposition: A | Discharge status: Home / Self Care | Payer: 59 | Source: Ambulatory Visit | Attending: Internal Medicine | Admitting: Internal Medicine

## 2021-07-27 DIAGNOSIS — Z1231 Encounter for screening mammogram for malignant neoplasm of breast: Secondary | ICD-10-CM | POA: Diagnosis not present

## 2021-08-02 IMAGING — MG MM DIGITAL SCREENING BILAT W/ TOMO AND CAD
8 series · 8 of 24 positions shown · non-contrast
Comparison: None.

CLINICAL DATA: Screening.

EXAM:
DIGITAL SCREENING BILATERAL MAMMOGRAM WITH TOMOSYNTHESIS AND CAD
TECHNIQUE: Bilateral screening digital craniocaudal and mediolateral oblique
mammograms were obtained. Bilateral screening digital breast
tomosynthesis was performed. The images were evaluated with
computer-aided detection.

[R CC synth-2D]
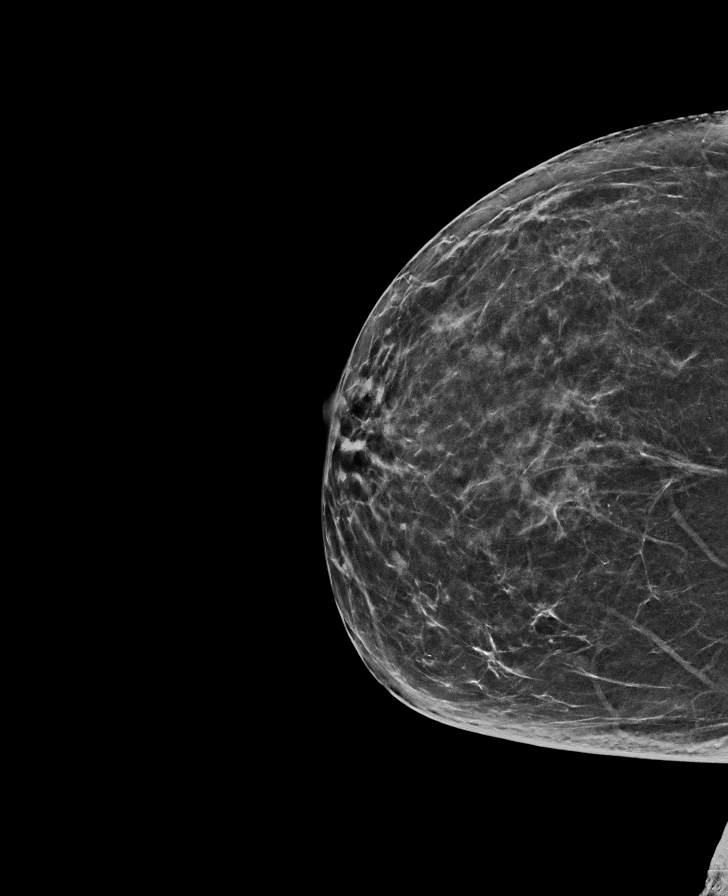

[L MLO synth-2D]
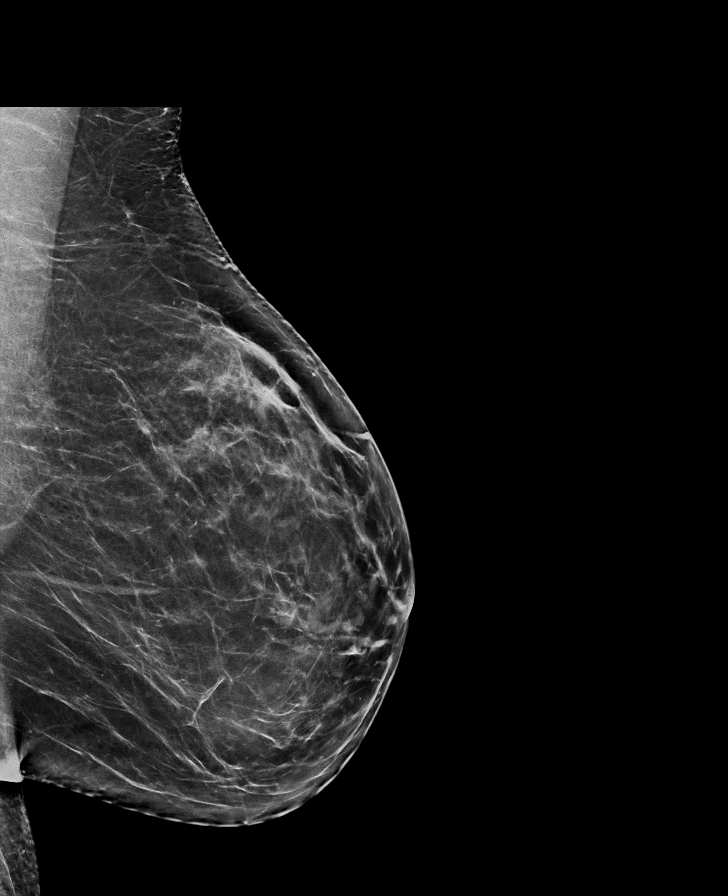

[R MLO synth-2D]
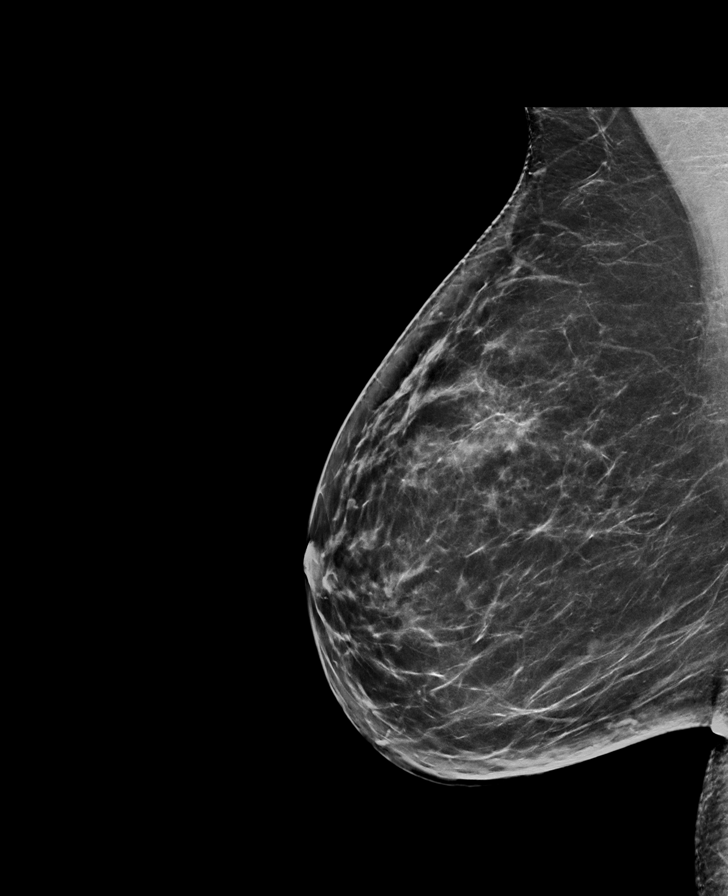

[L CC synth-2D]
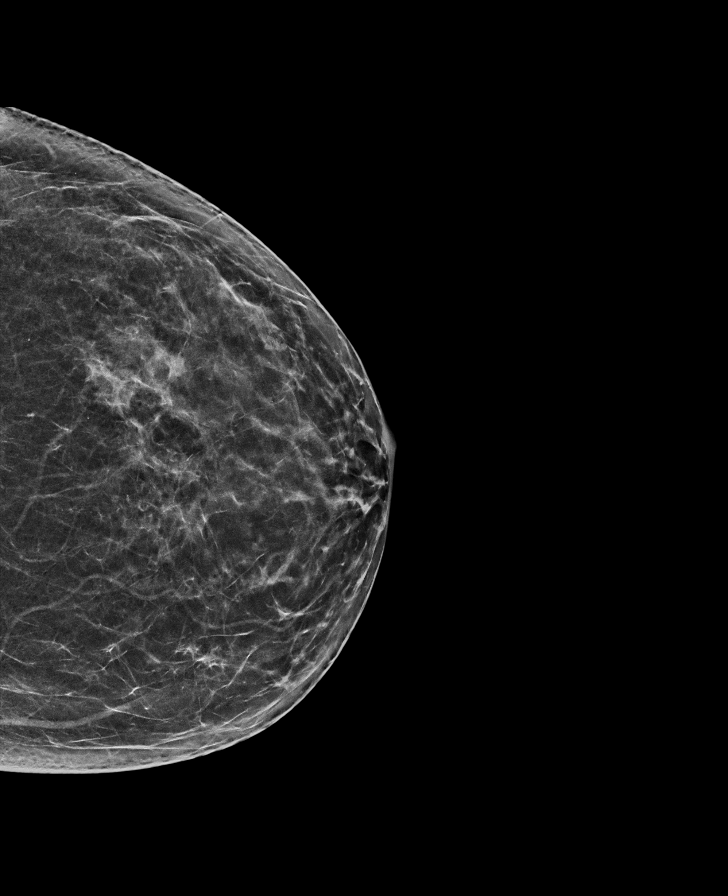

[R MLO tomo · tomo slice 39/78.0]
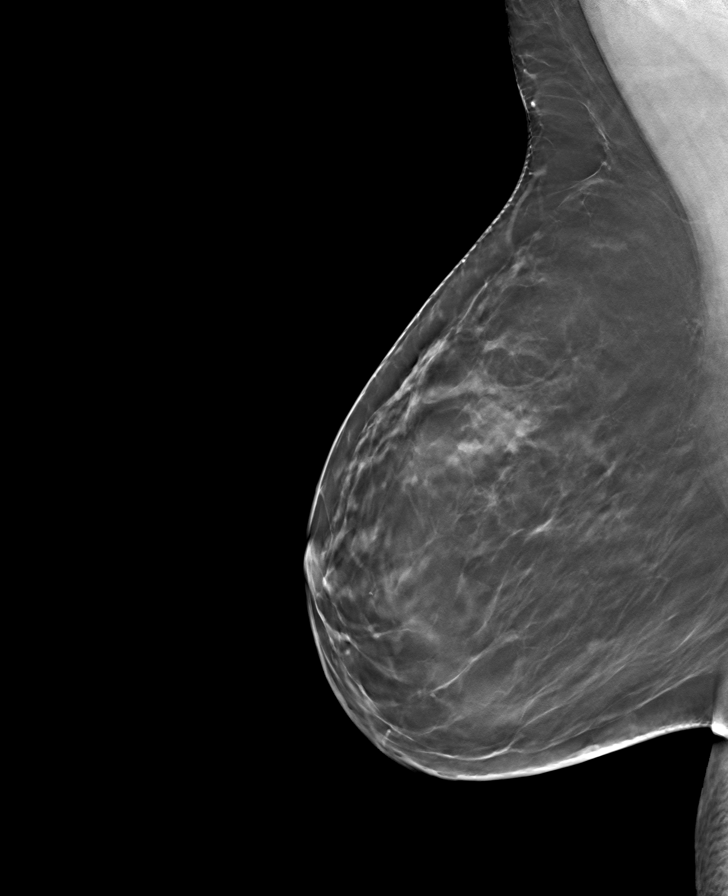

[R CC tomo · tomo slice 33/66.0]
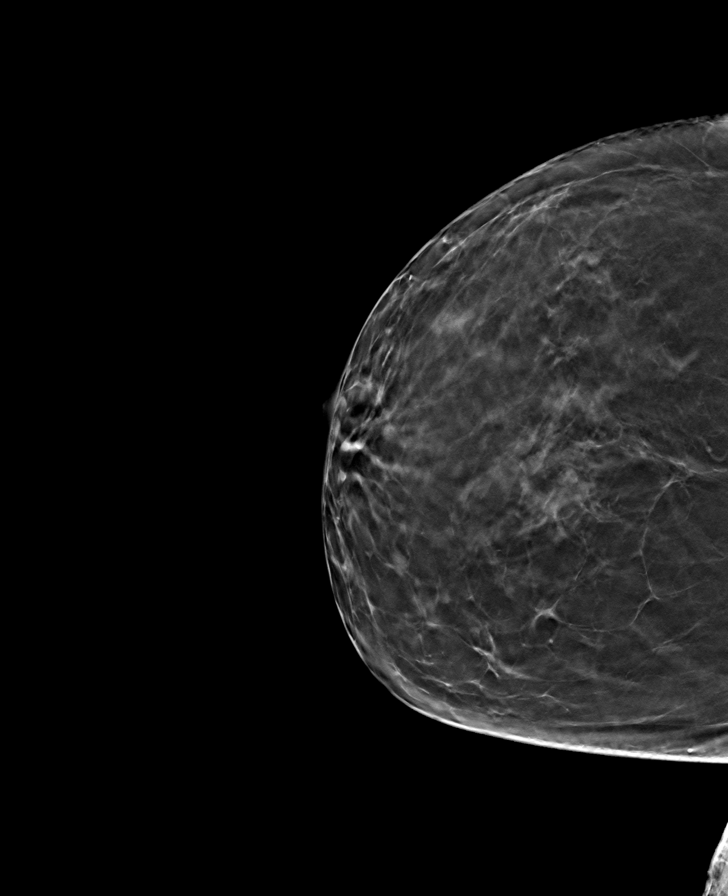

[L MLO tomo · tomo slice 37/74.0]
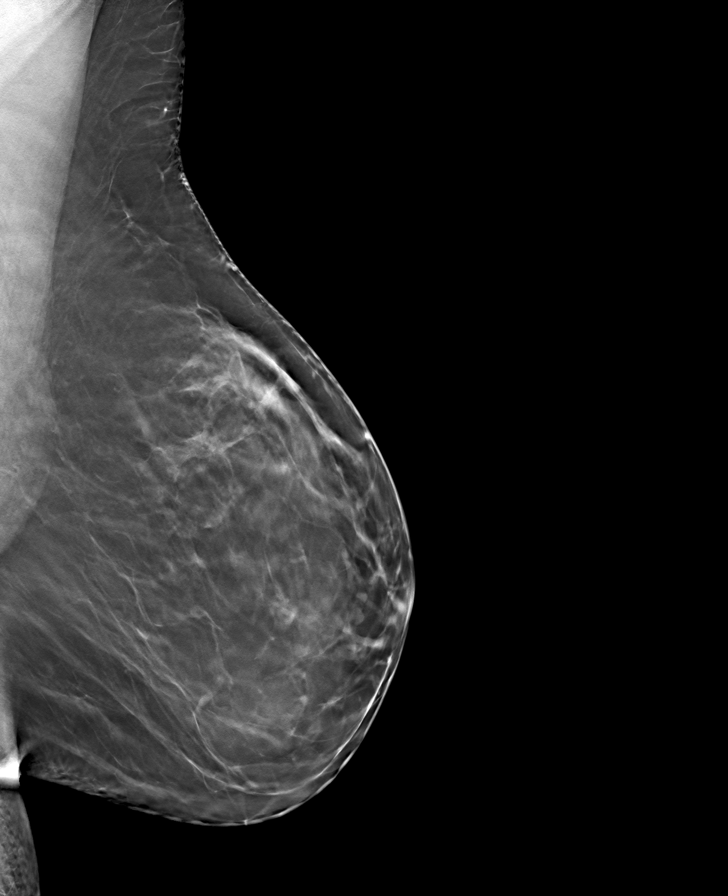

[L CC tomo · tomo slice 33/64.0]
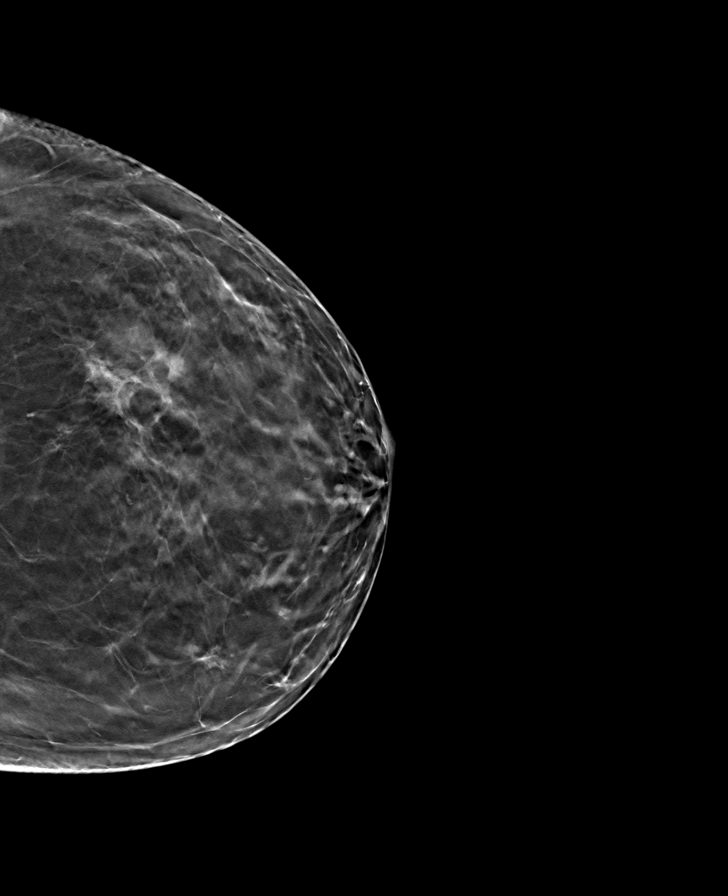

[8 of 24 positions shown; findings below may reference images not displayed]

ACR Breast Density Category c: The breast tissue is heterogeneously
dense, which may obscure small masses
FINDINGS: There are no findings suspicious for malignancy. The images were
evaluated with computer-aided detection.
IMPRESSION: No mammographic evidence of malignancy. A result letter of this
screening mammogram will be mailed directly to the patient.

RECOMMENDATION:
Screening mammogram in one year. (Code:CQ-1-UDH)

BI-RADS CATEGORY  1: Negative.

## 2021-12-19 ENCOUNTER — Other Ambulatory Visit: Payer: Self-pay | Admitting: Nurse Practitioner

## 2021-12-19 ENCOUNTER — Other Ambulatory Visit (HOSPITAL_COMMUNITY)
Admission: RE | Admit: 2021-12-19 | Discharge: 2021-12-19 | Disposition: A | Discharge status: Home / Self Care | Payer: 59 | Source: Ambulatory Visit | Attending: Nurse Practitioner | Admitting: Nurse Practitioner

## 2021-12-19 DIAGNOSIS — R61 Generalized hyperhidrosis: Secondary | ICD-10-CM | POA: Diagnosis not present

## 2021-12-19 DIAGNOSIS — Z01419 Encounter for gynecological examination (general) (routine) without abnormal findings: Secondary | ICD-10-CM | POA: Diagnosis not present

## 2021-12-19 DIAGNOSIS — R87810 Cervical high risk human papillomavirus (HPV) DNA test positive: Secondary | ICD-10-CM | POA: Insufficient documentation

## 2021-12-19 DIAGNOSIS — R87610 Atypical squamous cells of undetermined significance on cytologic smear of cervix (ASC-US): Secondary | ICD-10-CM | POA: Insufficient documentation

## 2021-12-19 DIAGNOSIS — N926 Irregular menstruation, unspecified: Secondary | ICD-10-CM | POA: Diagnosis not present

## 2021-12-19 DIAGNOSIS — Z30431 Encounter for routine checking of intrauterine contraceptive device: Secondary | ICD-10-CM | POA: Diagnosis not present

## 2021-12-19 DIAGNOSIS — L309 Dermatitis, unspecified: Secondary | ICD-10-CM | POA: Diagnosis not present

## 2021-12-24 LAB — CYTOLOGY - PAP
Comment: NEGATIVE
Diagnosis: UNDETERMINED — AB
High risk HPV: NEGATIVE

## 2021-12-29 ENCOUNTER — Encounter: Payer: Self-pay | Admitting: Internal Medicine

## 2021-12-31 ENCOUNTER — Ambulatory Visit (INDEPENDENT_AMBULATORY_CARE_PROVIDER_SITE_OTHER): Payer: 59 | Admitting: Internal Medicine

## 2021-12-31 ENCOUNTER — Encounter: Payer: Self-pay | Admitting: Internal Medicine

## 2021-12-31 DIAGNOSIS — F411 Generalized anxiety disorder: Secondary | ICD-10-CM | POA: Insufficient documentation

## 2021-12-31 DIAGNOSIS — F321 Major depressive disorder, single episode, moderate: Secondary | ICD-10-CM | POA: Diagnosis not present

## 2021-12-31 MED ORDER — FLUOXETINE HCL 10 MG PO CAPS: 10.0000 mg | ORAL_CAPSULE | Freq: Every day | ORAL | 1 refills | Status: DC

## 2021-12-31 NOTE — Assessment & Plan Note (Signed)
[place] Office Visit from 12/31/2021 in [facility]  PHQ-9 Total Score 14     Started Prozac Adjust dose according to response Managing stress material provided

## 2021-12-31 NOTE — Progress Notes (Signed)
Virtual Visit via Telephone Note   This visit type was conducted due to national recommendations for restrictions regarding the COVID-19 Pandemic (e.g. social distancing) in an effort to limit this patient's exposure and mitigate transmission in our community.  Due to her co-morbid illnesses, this patient is at least at moderate risk for complications without adequate follow up.  This format is felt to be most appropriate for this patient at this time.  The patient did not have access to video technology/had technical difficulties with video requiring transitioning to audio format only (telephone).  All issues noted in this document were discussed and addressed.  No physical exam could be performed with this format.  Evaluation Performed:  Follow-up visit  Date:  12/31/2021   ID:  Tiffany, Clark Jun 30, 1980, MRN [id number]  Patient Location: Home Provider Location: Office/Clinic  Participants: Patient Location of Patient: Home Location of Provider: Telehealth Consent was obtain for visit to be over via telehealth. I verified that I am speaking with the correct person using two identifiers.  PCP:  Lindell Spar, MD   Chief Complaint: Stress at work and home  History of Present Illness:    Tiffany Clark is a 41 y.o. female who has a televisit for complaint of stress at work and home, has been feeling overwhelmed despite no change in her job role or any recent stressful event at home.  She also reports lack of interest in activities.  She denies insomnia or change in appetite.  Denies any SI or HI currently.  She has tried Zoloft in the past when she was going through divorce process, but did not tolerate it.  The patient does not have symptoms concerning for COVID-19 infection (fever, chills, cough, or new shortness of breath).   Past Medical, Surgical, Social History, Allergies, and Medications have been Reviewed.  Past Medical History:  Diagnosis Date   Abnormal Pap  smear    Asthma    rarely uses inhaler - last use in 2007   Gestational diabetes 2016   with 2016 preg, diet controlled, no meds   H/O varicella    Headache(784.0)    otc med prn   Heartburn in pregnancy    History of kidney stones    passed stone - no surgery required   Human papillomavirus in conditions classified elsewhere and of unspecified site    Intrauterine normal pregnancy 04/29/2014   Missed ab    x 3 - no surgery required    Other abnormalities in shape or position of gravid uterus and of neighboring structures, antepartum    prolapse during pregnancy   SVD (spontaneous vaginal delivery)    x 3   Threatened abortion in first trimester 07/24/2013   Urinary retention 10/03/2011   Past Surgical History:  Procedure Laterality Date   CARPAL TUNNEL RELEASE     Right  WRIST ONLY   CESAREAN SECTION N/A 04/29/2014   Procedure: CESAREAN SECTION;  Surgeon: Annalee Genta, DO;  Location: [facility];  Service: Obstetrics;  Laterality: N/A;   CESAREAN SECTION N/A    [employer]rees[employer] 01/10/2020   LITHOTRIPSY     x 1   WISDOM TOOTH EXTRACTION       Current Meds  Medication Sig   levonorgestrel (MIRENA, 52 MG,) 20 MCG/24HR IUD      Allergies:   Aleve [naproxen sodium], Hydrocodone, Naproxen, and Latex   ROS:   Please see the history of present illness.     All  other systems reviewed and are negative.   Labs/Other Tests and Data Reviewed:    Recent Labs: 07/20/2021: ALT 14; BUN 15; Creatinine, Ser 0.83; Hemoglobin 13.5; Platelets 345; Potassium 4.5; Sodium 141; TSH 1.360   Recent Lipid Panel Lab Results  Component Value Date/Time   CHOL 157 07/20/2021 09:13 AM   TRIG 59 07/20/2021 09:13 AM   HDL 36 (L) 07/20/2021 09:13 AM   CHOLHDL 4.4 07/20/2021 09:13 AM   LDLCALC 109 (H) 07/20/2021 09:13 AM    Wt Readings from Last 3 Encounters:  07/13/21 172 lb 6.4 oz (78.2 kg)  07/13/20 184 lb 6.4 oz (83.6 kg)  01/11/20 174 lb (78.9 kg)     ASSESSMENT & PLAN:    Current moderate  episode of major depressive disorder without prior episode (HCC) Flowsheet Row Office Visit from 12/31/2021 in [facility]  PHQ-9 Total Score 14      Started Prozac Adjust dose according to response Managing stress material provided   Time:   Today, I have spent 12 minutes reviewing the chart, including problem list, medications, and with the patient with telehealth technology discussing the above problems.   Medication Adjustments/Labs and Tests Ordered: Current medicines are reviewed at length with the patient today.  Concerns regarding medicines are outlined above.   Tests Ordered: No orders of the defined types were placed in this encounter.   Medication Changes: No orders of the defined types were placed in this encounter.    Note: This dictation was prepared with Dragon dictation along with smaller phrase technology. Similar sounding words can be transcribed inadequately or may not be corrected upon review. Any transcriptional errors that result from this process are unintentional.      Disposition:  Follow up  Signed, Anabel Halon, MD  12/31/2021 12:16 PM     [facility]

## 2021-12-31 NOTE — Telephone Encounter (Signed)
scheduled

## 2021-12-31 NOTE — Patient Instructions (Signed)
Please start taking Prozac as prescribed. ?

## 2022-01-01 DIAGNOSIS — N926 Irregular menstruation, unspecified: Secondary | ICD-10-CM | POA: Diagnosis not present

## 2022-01-01 DIAGNOSIS — Z30431 Encounter for routine checking of intrauterine contraceptive device: Secondary | ICD-10-CM | POA: Diagnosis not present

## 2022-02-07 ENCOUNTER — Encounter: Payer: Self-pay | Admitting: Internal Medicine

## 2022-02-07 ENCOUNTER — Ambulatory Visit (INDEPENDENT_AMBULATORY_CARE_PROVIDER_SITE_OTHER): Payer: 59 | Admitting: Internal Medicine

## 2022-02-07 DIAGNOSIS — F321 Major depressive disorder, single episode, moderate: Secondary | ICD-10-CM | POA: Diagnosis not present

## 2022-02-07 MED ORDER — FLUOXETINE HCL 20 MG PO CAPS: 20.0000 mg | ORAL_CAPSULE | Freq: Every day | ORAL | 3 refills | Status: DC

## 2022-02-07 NOTE — Progress Notes (Signed)
Virtual Visit via Telephone Note   This visit type was conducted via telephone. This format is felt to be most appropriate for this patient at this time.  The patient did not have access to video technology/had technical difficulties with video requiring transitioning to audio format only (telephone).  All issues noted in this document were discussed and addressed.  No physical exam could be performed with this format.  Evaluation Performed:  Follow-up visit  Date:  02/07/2022   ID:  Mairead, Tiffany Clark 1980/10/26, MRN [id number]  Patient Location: Home Provider Location: Office/Clinic  Participants: Patient Location of Patient: Home Location of Provider: Telehealth Consent was obtain for visit to be over via telehealth. I verified that I am speaking with the correct person using two identifiers.  PCP:  Anabel Halon, MD   Chief Complaint: Follow-up of depression  History of Present Illness:    Tiffany Clark is a 41 y.o. female who has a televisit for follow-up of depression.  She was recently placed on Prozac 10 mg daily for MDD.  She has been having stress at work and home.  She denies insomnia, but her sleep is interrupted as she is taking care of her child.  She has noticed some improvement in her symptoms with Prozac, but still feels overwhelmed at times.  She denies any change in appetite.  Denies any SI or HI currently.  The patient does not have symptoms concerning for COVID-19 infection (fever, chills, cough, or new shortness of breath).   Past Medical, Surgical, Social History, Allergies, and Medications have been Reviewed.  Past Medical History:  Diagnosis Date   Abnormal Pap smear    Asthma    rarely uses inhaler - last use in 2007   Gestational diabetes 2016   with 2016 preg, diet controlled, no meds   H/O varicella    Headache(784.0)    otc med prn   Heartburn in pregnancy    History of kidney stones    passed stone - no surgery required   Human  papillomavirus in conditions classified elsewhere and of unspecified site    Intrauterine normal pregnancy 04/29/2014   Missed ab    x 3 - no surgery required    Other abnormalities in shape or position of gravid uterus and of neighboring structures, antepartum    prolapse during pregnancy   SVD (spontaneous vaginal delivery)    x 3   Threatened abortion in first trimester 07/24/2013   Urinary retention 10/03/2011   Past Surgical History:  Procedure Laterality Date   CARPAL TUNNEL RELEASE     Right  WRIST ONLY   CESAREAN SECTION N/A 04/29/2014   Procedure: CESAREAN SECTION;  Surgeon: Sharon Seller, DO;  Location: [facility];  Service: Obstetrics;  Laterality: N/A;   CESAREAN SECTION N/A    [employer]reesia 01/10/2020   LITHOTRIPSY     x 1   WISDOM TOOTH EXTRACTION       Current Meds  Medication Sig   FLUoxetine (PROZAC) 10 MG capsule Take 1 capsule (10 mg total) by mouth daily.   levonorgestrel (MIRENA, 52 MG,) 20 MCG/24HR IUD      Allergies:   Aleve [naproxen sodium], Hydrocodone, Naproxen, and Latex   ROS:   Please see the history of present illness.     All other systems reviewed and are negative.   Labs/Other Tests and Data Reviewed:    Recent Labs: 07/20/2021: ALT 14; BUN 15; Creatinine, Ser 0.83; Hemoglobin 13.5;  Platelets 345; Potassium 4.5; Sodium 141; TSH 1.360   Recent Lipid Panel Lab Results  Component Value Date/Time   CHOL 157 07/20/2021 09:13 AM   TRIG 59 07/20/2021 09:13 AM   HDL 36 (L) 07/20/2021 09:13 AM   CHOLHDL 4.4 07/20/2021 09:13 AM   LDLCALC 109 (H) 07/20/2021 09:13 AM    Wt Readings from Last 3 Encounters:  07/13/21 172 lb 6.4 oz (78.2 kg)  07/13/20 184 lb 6.4 oz (83.6 kg)  01/11/20 174 lb (78.9 kg)     ASSESSMENT & PLAN:    Current moderate episode of major depressive disorder without prior episode (HCC) Flowsheet Row Office Visit from 02/07/2022 in [facility]  PHQ-9 Total Score 7      Slightly improved with Prozac, but still  uncontrolled Increase dose of Prozac to 20 mg QD Adjust dose according to response Managing stress material provided    Time:   Today, I have spent 12 minutes reviewing the chart, including problem list, medications, and with the patient with telehealth technology discussing the above problems.   Medication Adjustments/Labs and Tests Ordered: Current medicines are reviewed at length with the patient today.  Concerns regarding medicines are outlined above.   Tests Ordered: No orders of the defined types were placed in this encounter.   Medication Changes: No orders of the defined types were placed in this encounter.    Note: This dictation was prepared with Dragon dictation along with smaller phrase technology. Similar sounding words can be transcribed inadequately or may not be corrected upon review. Any transcriptional errors that result from this process are unintentional.      Disposition:  Follow up  Signed, Anabel Halon, MD  02/07/2022 11:55 AM     [facility]

## 2022-02-07 NOTE — Patient Instructions (Addendum)
Please start taking Prozac 20 mg daily instead of 10 mg. 

## 2022-02-07 NOTE — Assessment & Plan Note (Signed)
Flowsheet Row Office Visit from 02/07/2022 in [facility]  PHQ-9 Total Score 7      Slightly improved with Prozac, but still uncontrolled Increase dose of Prozac to 20 mg QD Adjust dose according to response Managing stress material provided

## 2022-06-03 ENCOUNTER — Emergency Department (HOSPITAL_COMMUNITY)
Admission: EM | Admit: 2022-06-03 | Discharge: 2022-06-03 | Disposition: A | Discharge status: Home / Self Care | Payer: 59 | Attending: Emergency Medicine | Admitting: Emergency Medicine

## 2022-06-03 ENCOUNTER — Other Ambulatory Visit: Payer: Self-pay

## 2022-06-03 ENCOUNTER — Emergency Department (HOSPITAL_COMMUNITY): Payer: 59

## 2022-06-03 DIAGNOSIS — E871 Hypo-osmolality and hyponatremia: Secondary | ICD-10-CM | POA: Diagnosis not present

## 2022-06-03 DIAGNOSIS — R1010 Upper abdominal pain, unspecified: Secondary | ICD-10-CM

## 2022-06-03 DIAGNOSIS — R109 Unspecified abdominal pain: Secondary | ICD-10-CM | POA: Diagnosis not present

## 2022-06-03 DIAGNOSIS — K429 Umbilical hernia without obstruction or gangrene: Secondary | ICD-10-CM | POA: Diagnosis not present

## 2022-06-03 DIAGNOSIS — R1011 Right upper quadrant pain: Secondary | ICD-10-CM | POA: Insufficient documentation

## 2022-06-03 DIAGNOSIS — R1012 Left upper quadrant pain: Secondary | ICD-10-CM | POA: Insufficient documentation

## 2022-06-03 DIAGNOSIS — R079 Chest pain, unspecified: Secondary | ICD-10-CM | POA: Diagnosis not present

## 2022-06-03 DIAGNOSIS — R0789 Other chest pain: Secondary | ICD-10-CM | POA: Diagnosis not present

## 2022-06-03 DIAGNOSIS — Z91040 Latex allergy status: Secondary | ICD-10-CM | POA: Diagnosis not present

## 2022-06-03 DIAGNOSIS — N200 Calculus of kidney: Secondary | ICD-10-CM | POA: Diagnosis not present

## 2022-06-03 LAB — CBC
HCT: 39.1 % (ref 36.0–46.0)
Hemoglobin: 13.4 g/dL (ref 12.0–15.0)
MCH: 30.5 pg (ref 26.0–34.0)
MCHC: 34.3 g/dL (ref 30.0–36.0)
MCV: 88.9 fL (ref 80.0–100.0)
Platelets: 372 10*3/uL (ref 150–400)
RBC: 4.4 MIL/uL (ref 3.87–5.11)
RDW: 12.2 % (ref 11.5–15.5)
WBC: 9.7 10*3/uL (ref 4.0–10.5)
nRBC: 0 % (ref 0.0–0.2)

## 2022-06-03 LAB — HEPATIC FUNCTION PANEL
ALT: 24 U/L (ref 0–44)
AST: 21 U/L (ref 15–41)
Albumin: 3.9 g/dL (ref 3.5–5.0)
Alkaline Phosphatase: 60 U/L (ref 38–126)
Bilirubin, Direct: 0.1 mg/dL (ref 0.0–0.2)
Indirect Bilirubin: 0.4 mg/dL (ref 0.3–0.9)
Total Bilirubin: 0.5 mg/dL (ref 0.3–1.2)
Total Protein: 7.6 g/dL (ref 6.5–8.1)

## 2022-06-03 LAB — URINALYSIS, ROUTINE W REFLEX MICROSCOPIC
Bilirubin Urine: NEGATIVE
Glucose, UA: NEGATIVE mg/dL
Hgb urine dipstick: NEGATIVE
Ketones, ur: NEGATIVE mg/dL
Leukocytes,Ua: NEGATIVE
Nitrite: NEGATIVE
Protein, ur: NEGATIVE mg/dL
Specific Gravity, Urine: 1.006 (ref 1.005–1.030)
pH: 7 (ref 5.0–8.0)

## 2022-06-03 LAB — BASIC METABOLIC PANEL
Anion gap: 6 (ref 5–15)
BUN: 16 mg/dL (ref 6–20)
CO2: 22 mmol/L (ref 22–32)
Calcium: 8.9 mg/dL (ref 8.9–10.3)
Chloride: 103 mmol/L (ref 98–111)
Creatinine, Ser: 0.76 mg/dL (ref 0.44–1.00)
GFR, Estimated: >60 mL/min (ref >60)
Glucose, Bld: 103 mg/dL — ABNORMAL HIGH (ref 70–99)
Potassium: 3.6 mmol/L (ref 3.5–5.1)
Sodium: 131 mmol/L — ABNORMAL LOW (ref 135–145)

## 2022-06-03 LAB — LIPASE, BLOOD: Lipase: 31 U/L (ref 11–51)

## 2022-06-03 LAB — PREGNANCY, URINE: Preg Test, Ur: NEGATIVE

## 2022-06-03 LAB — TROPONIN I (HIGH SENSITIVITY)
Troponin I (High Sensitivity): <2 ng/L (ref <18)
Troponin I (High Sensitivity): <2 ng/L (ref <18)

## 2022-06-03 LAB — D-DIMER, QUANTITATIVE: D-Dimer, Quant: <0.27 ug/mL-FEU (ref 0.00–0.50)

## 2022-06-03 MED ORDER — ONDANSETRON HCL 4 MG/2ML IJ SOLN
4.0000 mg | Freq: Once | INTRAMUSCULAR | Status: AC
  Administered 2022-06-03: 4 mg via INTRAVENOUS
  Filled 2022-06-03: qty 2

## 2022-06-03 MED ORDER — IOHEXOL 350 MG/ML SOLN
100.0000 mL | Freq: Once | INTRAVENOUS | Status: AC | PRN
  Administered 2022-06-03: 100 mL via INTRAVENOUS

## 2022-06-03 MED ORDER — FENTANYL CITRATE PF 50 MCG/ML IJ SOSY
50.0000 ug | PREFILLED_SYRINGE | Freq: Once | INTRAMUSCULAR | Status: AC
  Administered 2022-06-03: 50 ug via INTRAVENOUS
  Filled 2022-06-03: qty 1

## 2022-06-03 NOTE — ED Triage Notes (Signed)
Pt presents with chest and abdominal pain that started last night. States the pain feels like pressure. Endorses nausea. Pt tearful in triage and states that she has bad anxiety.

## 2022-06-03 NOTE — Discharge Instructions (Addendum)
You are seen in the emergency department for chest abdominal pain radiating into your back shoulder.  You had blood work EKG and a CAT scan of your chest abdomen and pelvis that did not show an obvious explanation for your symptoms.  Please follow-up with your regular doctor.  Return to the emergency department if any worsening or concerning symptoms

## 2022-06-03 NOTE — ED Provider Notes (Signed)
[facility] Provider Note   CSN: [id number] Arrival date & time: 06/03/22  2025     History {Add pertinent medical, surgical, social history, OB history to HPI:1} Chief Complaint  Patient presents with   Abdominal Pain   Chest Pain    Tiffany Clark is a 42 y.o. female.  Has had some discomfort in her upper abdomen and chest into her left shoulder and back that has been starting since last night.  She said it has been fairly constant.  She is also felt nauseous and a little short of breath.  She has tried nothing for her symptoms, has never had these before.  No history of cardiac disease.  No significant PE risk factors other than she has an IUD.    The history is provided by the patient.  Abdominal Pain Pain location:  LUQ, RUQ and epigastric Pain quality: aching   Pain radiates to:  Chest, L shoulder and back Pain severity:  Moderate Onset quality:  Gradual Duration:  2 days Timing:  Constant Progression:  Unchanged Chronicity:  New Context: not trauma   Relieved by:  None tried Worsened by:  Nothing Ineffective treatments:  None tried Associated symptoms: chest pain, fatigue, nausea and shortness of breath   Associated symptoms: no constipation, no cough, no diarrhea, no dysuria and no fever   Chest Pain Associated symptoms: abdominal pain, back pain, fatigue, nausea and shortness of breath   Associated symptoms: no cough and no fever        Home Medications Prior to Admission medications   Medication Sig Start Date End Date Taking? Authorizing Provider  FLUoxetine (PROZAC) 20 MG capsule Take 1 capsule (20 mg total) by mouth daily. 02/07/22   Lindell Spar, MD  levonorgestrel (MIRENA, 52 MG,) 20 MCG/24HR IUD     [provider]      Allergies    Aleve [naproxen sodium], Hydrocodone, Naproxen, and Latex    Review of Systems   Review of Systems  Constitutional:  Positive for fatigue. Negative for fever.   Eyes:  Negative for visual disturbance.  Respiratory:  Positive for shortness of breath. Negative for cough.   Cardiovascular:  Positive for chest pain.  Gastrointestinal:  Positive for abdominal pain and nausea. Negative for constipation and diarrhea.  Genitourinary:  Negative for dysuria.  Musculoskeletal:  Positive for back pain.    Physical Exam Updated Vital Signs BP (!) 130/116   Pulse 89   Temp 98.2 F (36.8 C) (Oral)   Resp 20   Wt 86.2 kg   SpO2 95%   BMI 32.61 kg/m  Physical Exam Vitals and nursing note reviewed.  Constitutional:      General: She is not in acute distress.    Appearance: Normal appearance. She is well-developed.  HENT:     Head: Normocephalic and atraumatic.  Eyes:     Conjunctiva/sclera: Conjunctivae normal.  Cardiovascular:     Rate and Rhythm: Normal rate and regular rhythm.     Heart sounds: No murmur heard. Pulmonary:     Effort: Pulmonary effort is normal. No respiratory distress.     Breath sounds: Normal breath sounds.  Abdominal:     Palpations: Abdomen is soft.     Tenderness: There is no abdominal tenderness. There is no guarding or rebound.  Musculoskeletal:        General: No swelling.     Cervical back: Neck supple.  Skin:    General: Skin  is warm and dry.     Capillary Refill: Capillary refill takes less than 2 seconds.  Neurological:     General: No focal deficit present.     Mental Status: She is alert.     ED Results / Procedures / Treatments   Labs (all labs ordered are listed, but only abnormal results are displayed) Labs Reviewed  BASIC METABOLIC PANEL - Abnormal; Notable for the following components:      Result Value   Sodium 131 (*)    Glucose, Bld 103 (*)    All other components within normal limits  CBC  LIPASE, BLOOD  URINALYSIS, ROUTINE W REFLEX MICROSCOPIC  HEPATIC FUNCTION PANEL  D-DIMER, QUANTITATIVE  PREGNANCY, URINE  TROPONIN I (HIGH SENSITIVITY)    EKG EKG  Interpretation  Date/Time:  Monday June 03 2022 20:35:35 EST Ventricular Rate:  94 PR Interval:  128 QRS Duration: 78 QT Interval:  350 QTC Calculation: 437 R Axis:   73 Text Interpretation: Normal sinus rhythm Nonspecific ST abnormality Abnormal ECG No previous ECGs available Confirmed by Aletta Edouard [phone]) on 06/03/2022 8:47:41 PM  Radiology No results found.  Procedures Procedures  {Document cardiac monitor, telemetry assessment procedure when appropriate:1}  Medications Ordered in ED Medications - No data to display  ED Course/ Medical Decision Making/ A&P   {   Click here for ABCD2, HEART and other calculatorsREFRESH Note before signing :1}                          Medical Decision Making Amount and/or Complexity of Data Reviewed Labs: ordered.   This patient complains of ***; this involves an extensive number of treatment Options and is a complaint that carries with it a high risk of complications and morbidity. The differential includes ***  I ordered, reviewed and interpreted labs, which included *** I ordered medication *** and reviewed PMP when indicated. I ordered imaging studies which included *** and I independently    visualized and interpreted imaging which showed *** Additional history obtained from *** Previous records obtained and reviewed *** I consulted *** and discussed lab and imaging findings and discussed disposition.  Cardiac monitoring reviewed, *** Social determinants considered, *** Critical Interventions: ***  After the interventions stated above, I reevaluated the patient and found *** Admission and further testing considered, ***   {Document critical care time when appropriate:1} {Document review of labs and clinical decision tools ie heart score, Chads2Vasc2 etc:1}  {Document your independent review of radiology images, and any outside records:1} {Document your discussion with family members, caretakers, and with  consultants:1} {Document social determinants of health affecting pt's care:1} {Document your decision making why or why not admission, treatments were needed:1} Final Clinical Impression(s) / ED Diagnoses Final diagnoses:  None    Rx / DC Orders ED Discharge Orders     None

## 2022-06-04 ENCOUNTER — Telehealth: Payer: Self-pay

## 2022-06-04 NOTE — Transitions of Care (Post Inpatient/ED Visit) (Signed)
   06/04/2022  Name: Tiffany Clark MRN: [id number] DOB: 07-25-1980  Today's TOC FU Call Status: Today's TOC FU Call Status:: Successful TOC FU Call Competed TOC FU Call Complete Date: 06/04/22  Transition Care Management Follow-up Telephone Call Date of Discharge: 06/03/22 Discharge [facility] Type of Discharge: Emergency Department Reason for ED Visit: Cardiac Conditions Cardiac Conditions Diagnosis:  (Nonspecific chest pain) How have you been since you were released from the hospital?: Better Any questions or concerns?: No  Items Reviewed: Did you receive and understand the discharge instructions provided?: Yes Medications obtained and verified?: Yes (Medications Reviewed) Any new allergies since your discharge?: No Dietary orders reviewed?: Yes Do you have support at home?: Yes  Home Care and Equipment/Supplies: [facility] Ordered?: No Any new equipment or medical supplies ordered?: No  Functional Questionnaire: Do you need assistance with bathing/showering or dressing?: No Do you need assistance with meal preparation?: No Do you need assistance with eating?: No Do you have difficulty maintaining continence: No Do you need assistance with getting out of bed/getting out of a chair/moving?: No Do you have difficulty managing or taking your medications?: No  Folllow up appointments reviewed: PCP Follow-up appointment confirmed?: No (refused) MD Provider Line Number:[phone] Given: Yes [facility] Follow-up appointment confirmed?: NA Do you need transportation to your follow-up appointment?: No Do you understand care options if your condition(s) worsen?: Yes-patient verbalized understanding    Daniels LPN [facility] [phone]

## 2022-06-06 ENCOUNTER — Other Ambulatory Visit: Payer: Self-pay | Admitting: Internal Medicine

## 2022-06-06 DIAGNOSIS — F321 Major depressive disorder, single episode, moderate: Secondary | ICD-10-CM

## 2022-07-18 ENCOUNTER — Other Ambulatory Visit (HOSPITAL_COMMUNITY): Payer: Self-pay | Admitting: Internal Medicine

## 2022-07-18 DIAGNOSIS — Z1231 Encounter for screening mammogram for malignant neoplasm of breast: Secondary | ICD-10-CM

## 2022-07-19 ENCOUNTER — Ambulatory Visit (INDEPENDENT_AMBULATORY_CARE_PROVIDER_SITE_OTHER): Payer: 59 | Admitting: Internal Medicine

## 2022-07-19 ENCOUNTER — Encounter: Payer: Self-pay | Admitting: Internal Medicine

## 2022-07-19 VITALS — BP 119/80 | HR 76 | Ht 64.0 in | Wt 201.2 lb

## 2022-07-19 DIAGNOSIS — R7303 Prediabetes: Secondary | ICD-10-CM | POA: Diagnosis not present

## 2022-07-19 DIAGNOSIS — E559 Vitamin D deficiency, unspecified: Secondary | ICD-10-CM | POA: Diagnosis not present

## 2022-07-19 DIAGNOSIS — E669 Obesity, unspecified: Secondary | ICD-10-CM | POA: Diagnosis not present

## 2022-07-19 DIAGNOSIS — E782 Mixed hyperlipidemia: Secondary | ICD-10-CM

## 2022-07-19 DIAGNOSIS — R079 Chest pain, unspecified: Secondary | ICD-10-CM | POA: Diagnosis not present

## 2022-07-19 DIAGNOSIS — F411 Generalized anxiety disorder: Secondary | ICD-10-CM | POA: Diagnosis not present

## 2022-07-19 NOTE — Assessment & Plan Note (Signed)
Lab Results  Component Value Date   HGBA1C 5.7 (H) 07/20/2021   Advised to follow low carb diet for now

## 2022-07-19 NOTE — Assessment & Plan Note (Signed)
Diet modification and moderate exercise advised. 

## 2022-07-19 NOTE — Assessment & Plan Note (Signed)
Well-controlled with Prozac to 20 mg QD Managing stress material provided

## 2022-07-19 NOTE — Assessment & Plan Note (Signed)
Had an episode of chest pain, went to ER ER chart reviewed, including imaging and EKG Appears to be likely due to anxiety/panic episode Denies any recurrent chest pain, dyspnea or palpitations

## 2022-07-19 NOTE — Assessment & Plan Note (Signed)
DASH diet advised Check lipid profile 

## 2022-07-19 NOTE — Patient Instructions (Signed)
Please continue to take medications as prescribed.  Please continue to follow low carb diet and perform moderate exercise/walking at least 150 mins/week.  Please get fasting blood tests done before the next visit. 

## 2022-07-19 NOTE — Progress Notes (Signed)
Established Patient Office Visit  Subjective:  Patient ID: Tiffany Clark, female    DOB: May 29, 1980  Age: 42 y.o. MRN: [id number]  CC:  Chief Complaint  Patient presents with   Depression    Follow up    HPI Tiffany Clark is a 42 y.o. female with past medical history of MDD, nephrolithiasis and gestational diabetes who presents for f/u of her chronic medical conditions.  She was placed on Prozac 10 mg daily for MDD and later increased dose to 20 mg.  She has been having stress at work and home.  She denies insomnia, but her sleep is interrupted as she is taking care of her child.  She has noticed improvement in her symptoms with Prozac.  She denies any change in appetite.  Denies any SI or HI currently.  She had an episode of upper chest wall area pain and left arm heaviness.  She went to ER, had EKG, which was negative for any signs of active ischemia.  She had CT of chest, which was negative for PE or aortic aneurysm.  She reports that she was stressed due to her husband's cardiac ablation procedure at that time and attributes her symptoms likely due to stress.  She denies any recurrent episode of chest pain, dyspnea or palpitations.  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 and of unspecified site    Intrauterine normal pregnancy 04/29/2014   Missed ab    x 3 - no surgery required    Other abnormalities in shape or position of gravid uterus and of neighboring structures, antepartum    prolapse during pregnancy   SVD (spontaneous vaginal delivery)    x 3   Threatened abortion in first trimester 07/24/2013   Urinary retention 10/03/2011     Past Surgical History:  Procedure Laterality Date   CARPAL TUNNEL RELEASE     Right  WRIST ONLY   CESAREAN SECTION N/A 04/29/2014   Procedure: CESAREAN SECTION;  Surgeon: Sharon Seller, DO;  Location: [facility];  Service: Obstetrics;  Laterality: N/A;   CESAREAN SECTION N/A    Phreesia 01/10/2020   LITHOTRIPSY     x 1   WISDOM TOOTH EXTRACTION      Family History  Adopted: Yes  Problem Relation Age of Onset   Other Neg Hx     Social History   Socioeconomic History   Marital status: Married    Spouse name: Not on file   Number of children: 4   Years of education: Not on file   Highest education level: Not on file  Occupational History    Employer: [employer]    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  Tobacco Use   Smoking status: Former    Packs/day: 0.75    Years: 6.00    Additional pack years: 0.00    Total pack years: 4.50    Types: Cigarettes    Quit date: 10/30/2004    Years since quitting: 17.7   Smokeless tobacco: Never  Substance and Sexual Activity   Alcohol use: No  Drug use: No   Sexual activity: Yes    Birth control/protection: None    Comment: pregnant  Other Topics Concern   Not on file  Social History Narrative   Not on file   Social Determinants of Health   Financial Resource Strain: Not on file  Food Insecurity: Not on file  Transportation Needs: Not on file  Physical Activity: Not on file  Stress: Not on file  Social Connections: Not on file  Intimate Partner Violence: Not on file    Outpatient Medications Prior to Visit  Medication Sig Dispense Refill   FLUoxetine (PROZAC) 20 MG capsule TAKE 1 CAPSULE(20 MG) BY MOUTH DAILY 30 capsule 3   levonorgestrel (MIRENA, 52 MG,) 20 MCG/24HR IUD      No facility-administered medications prior to visit.    Allergies  Allergen Reactions   Aleve [Naproxen Sodium]     Heart races and sweaty palms. Feels like panic attack. Can take Ibuprofen   Hydrocodone Nausea And Vomiting   Naproxen     Other  reaction(s): Unknown   Latex Rash    Other reaction(s): Unknown    ROS Review of Systems  Constitutional:  Negative for chills and fever.  HENT:  Negative for congestion, sinus pressure, sinus pain and sore throat.   Eyes:  Negative for pain and discharge.  Respiratory:  Negative for cough and shortness of breath.   Cardiovascular:  Negative for chest pain and palpitations.  Gastrointestinal:  Negative for abdominal pain, constipation, diarrhea, nausea and vomiting.  Endocrine: Negative for polydipsia and polyuria.  Genitourinary:  Negative for dysuria and hematuria.  Musculoskeletal:  Negative for neck pain and neck stiffness.  Skin:  Negative for rash.  Neurological:  Negative for dizziness and weakness.  Psychiatric/Behavioral:  Negative for agitation and behavioral problems.       Objective:    Physical Exam Vitals reviewed.  Constitutional:      General: She is not in acute distress.    Appearance: She is not diaphoretic.  HENT:     Head: Normocephalic and atraumatic.  Eyes:     General: No scleral icterus.    Extraocular Movements: Extraocular movements intact.  Cardiovascular:     Rate and Rhythm: Normal rate and regular rhythm.     Heart sounds: Normal heart sounds. No murmur heard. Pulmonary:     Breath sounds: Normal breath sounds. No wheezing or rales.  Abdominal:     Palpations: Abdomen is soft.     Tenderness: There is no abdominal tenderness.  Musculoskeletal:     Cervical back: Neck supple. No tenderness.     Right lower leg: No edema.     Left lower leg: No edema.  Skin:    General: Skin is warm.     Findings: No rash.  Neurological:     General: No focal deficit present.     Mental Status: She is alert and oriented to person, place, and time.     Cranial Nerves: No cranial nerve deficit.     Sensory: No sensory deficit.     Motor: No weakness.  Psychiatric:        Mood and Affect: Mood normal.        Behavior: Behavior normal.     BP  119/80 (BP Location: Right Arm, Patient Position: Sitting, Cuff Size: Large)   Pulse 76   Ht  (1.626 m)   Wt 201 lb 3.2 oz (91.3 kg)   SpO2 95%   BMI 34.54  kg/m  Wt Readings from Last 3 Encounters:  07/19/22 201 lb 3.2 oz (91.3 kg)  06/03/22 190 lb (86.2 kg)  07/13/21 172 lb 6.4 oz (78.2 kg)    Lab Results  Component Value Date   TSH 1.360 07/20/2021   Lab Results  Component Value Date   WBC 9.7 06/03/2022   HGB 13.4 06/03/2022   HCT 39.1 06/03/2022   MCV 88.9 06/03/2022   PLT 372 06/03/2022   Lab Results  Component Value Date   NA 131 (L) 06/03/2022   K 3.6 06/03/2022   CO2 22 06/03/2022   GLUCOSE 103 (H) 06/03/2022   BUN 16 06/03/2022   CREATININE 0.76 06/03/2022   BILITOT 0.5 06/03/2022   ALKPHOS 60 06/03/2022   AST 21 06/03/2022   ALT 24 06/03/2022   PROT 7.6 06/03/2022   ALBUMIN 3.9 06/03/2022   CALCIUM 8.9 06/03/2022   ANIONGAP 6 06/03/2022   EGFR 91 07/20/2021   Lab Results  Component Value Date   CHOL 157 07/20/2021   Lab Results  Component Value Date   HDL 36 (L) 07/20/2021   Lab Results  Component Value Date   LDLCALC 109 (H) 07/20/2021   Lab Results  Component Value Date   TRIG 59 07/20/2021   Lab Results  Component Value Date   CHOLHDL 4.4 07/20/2021   Lab Results  Component Value Date   HGBA1C 5.7 (H) 07/20/2021      Assessment & Plan:   Problem List Items Addressed This Visit       Other   Obesity (BMI 30-39.9)    Diet modification and moderate exercise advised      Mixed hyperlipidemia    DASH diet advised Check lipid profile      Relevant Orders   TSH   Lipid panel   GAD (generalized anxiety disorder) - Primary    Well-controlled with Prozac to 20 mg QD Managing stress material provided      Relevant Orders   TSH   CMP14+EGFR   CBC with Differential/Platelet   Vitamin D deficiency    Last vitamin D Lab Results  Component Value Date   VD25OH 24.8 (L) 07/20/2021  Advised to take Vitamin D 2000 IU  QD      Relevant Orders   VITAMIN D 25 Hydroxy (Vit-D Deficiency, Fractures)   Prediabetes    Lab Results  Component Value Date   HGBA1C 5.7 (H) 07/20/2021  Advised to follow low carb diet for now      Relevant Orders   Hemoglobin A1c   CMP14+EGFR   Chest pain    Had an episode of chest pain, went to ER ER chart reviewed, including imaging and EKG Appears to be likely due to anxiety/panic episode Denies any recurrent chest pain, dyspnea or palpitations       No orders of the defined types were placed in this encounter.   Follow-up: Return in about 6 months (around 01/18/2023).    Anabel Halon, MD

## 2022-07-19 NOTE — Assessment & Plan Note (Signed)
Last vitamin D Lab Results  Component Value Date   VD25OH 24.8 (L) 07/20/2021   Advised to take Vitamin D 2000 IU QD

## 2022-08-02 ENCOUNTER — Ambulatory Visit (HOSPITAL_COMMUNITY)
Admission: RE | Admit: 2022-08-02 | Discharge: 2022-08-02 | Disposition: A | Discharge status: Home / Self Care | Payer: 59 | Source: Ambulatory Visit | Attending: Internal Medicine | Admitting: Internal Medicine

## 2022-08-02 DIAGNOSIS — E782 Mixed hyperlipidemia: Secondary | ICD-10-CM | POA: Diagnosis not present

## 2022-08-02 DIAGNOSIS — F321 Major depressive disorder, single episode, moderate: Secondary | ICD-10-CM | POA: Diagnosis not present

## 2022-08-02 DIAGNOSIS — Z1231 Encounter for screening mammogram for malignant neoplasm of breast: Secondary | ICD-10-CM | POA: Insufficient documentation

## 2022-08-02 DIAGNOSIS — R7303 Prediabetes: Secondary | ICD-10-CM | POA: Diagnosis not present

## 2022-08-02 DIAGNOSIS — E559 Vitamin D deficiency, unspecified: Secondary | ICD-10-CM | POA: Diagnosis not present

## 2022-08-03 LAB — CBC WITH DIFFERENTIAL/PLATELET
Basophils Absolute: 0 10*3/uL (ref 0.0–0.2)
Basos: 0 %
EOS (ABSOLUTE): 0.1 10*3/uL (ref 0.0–0.4)
Eos: 2 %
Hematocrit: 40.7 % (ref 34.0–46.6)
Hemoglobin: 13.5 g/dL (ref 11.1–15.9)
Immature Grans (Abs): 0 10*3/uL (ref 0.0–0.1)
Immature Granulocytes: 0 %
Lymphocytes Absolute: 1.6 10*3/uL (ref 0.7–3.1)
Lymphs: 22 %
MCH: 30.2 pg (ref 26.6–33.0)
MCHC: 33.2 g/dL (ref 31.5–35.7)
MCV: 91 fL (ref 79–97)
Monocytes Absolute: 0.5 10*3/uL (ref 0.1–0.9)
Monocytes: 7 %
Neutrophils Absolute: 5.1 10*3/uL (ref 1.4–7.0)
Neutrophils: 69 %
Platelets: 392 10*3/uL (ref 150–450)
RBC: 4.47 x10E6/uL (ref 3.77–5.28)
RDW: 12.2 % (ref 11.7–15.4)
WBC: 7.5 10*3/uL (ref 3.4–10.8)

## 2022-08-03 LAB — CMP14+EGFR
ALT: 19 IU/L (ref 0–32)
AST: 16 IU/L (ref 0–40)
Albumin/Globulin Ratio: 1.4 (ref 1.2–2.2)
Albumin: 4.2 g/dL (ref 3.9–4.9)
Alkaline Phosphatase: 80 IU/L (ref 44–121)
BUN/Creatinine Ratio: 13 (ref 9–23)
BUN: 11 mg/dL (ref 6–24)
Bilirubin Total: 0.5 mg/dL (ref 0.0–1.2)
CO2: 20 mmol/L (ref 20–29)
Calcium: 9.7 mg/dL (ref 8.7–10.2)
Chloride: 102 mmol/L (ref 96–106)
Creatinine, Ser: 0.85 mg/dL (ref 0.57–1.00)
Globulin, Total: 3 g/dL (ref 1.5–4.5)
Glucose: 88 mg/dL (ref 70–99)
Potassium: 4.3 mmol/L (ref 3.5–5.2)
Sodium: 138 mmol/L (ref 134–144)
Total Protein: 7.2 g/dL (ref 6.0–8.5)
eGFR: 88 mL/min/{1.73_m2} (ref >59)

## 2022-08-03 LAB — TSH: TSH: 1.13 u[IU]/mL (ref 0.450–4.500)

## 2022-08-03 LAB — HEMOGLOBIN A1C
Est. average glucose Bld gHb Est-mCnc: 117 mg/dL
Hgb A1c MFr Bld: 5.7 % — ABNORMAL HIGH (ref 4.8–5.6)

## 2022-08-03 LAB — LIPID PANEL
Chol/HDL Ratio: 5.1 ratio — ABNORMAL HIGH (ref 0.0–4.4)
Cholesterol, Total: 183 mg/dL (ref 100–199)
HDL: 36 mg/dL — ABNORMAL LOW (ref >39)
LDL Chol Calc (NIH): 123 mg/dL — ABNORMAL HIGH (ref 0–99)
Triglycerides: 133 mg/dL (ref 0–149)
VLDL Cholesterol Cal: 24 mg/dL (ref 5–40)

## 2022-08-03 LAB — VITAMIN D 25 HYDROXY (VIT D DEFICIENCY, FRACTURES): Vit D, 25-Hydroxy: 20.3 ng/mL — ABNORMAL LOW (ref 30.0–100.0)

## 2022-08-20 ENCOUNTER — Other Ambulatory Visit: Payer: Self-pay | Admitting: Internal Medicine

## 2022-08-20 DIAGNOSIS — F321 Major depressive disorder, single episode, moderate: Secondary | ICD-10-CM

## 2022-10-09 ENCOUNTER — Other Ambulatory Visit: Payer: Self-pay | Admitting: Oncology

## 2022-10-09 DIAGNOSIS — Z006 Encounter for examination for normal comparison and control in clinical research program: Secondary | ICD-10-CM

## 2022-11-20 ENCOUNTER — Other Ambulatory Visit (HOSPITAL_COMMUNITY)
Admission: RE | Admit: 2022-11-20 | Discharge: 2022-11-20 | Disposition: A | Discharge status: Home / Self Care | Payer: 59 | Source: Other Acute Inpatient Hospital | Attending: Oncology | Admitting: Oncology

## 2022-11-20 ENCOUNTER — Other Ambulatory Visit: Payer: Self-pay | Admitting: Internal Medicine

## 2022-11-20 DIAGNOSIS — Z006 Encounter for examination for normal comparison and control in clinical research program: Secondary | ICD-10-CM | POA: Insufficient documentation

## 2022-11-20 DIAGNOSIS — F321 Major depressive disorder, single episode, moderate: Secondary | ICD-10-CM

## 2022-12-07 LAB — GENECONNECT MOLECULAR SCREEN: Genetic Analysis Overall Interpretation: NEGATIVE

## 2022-12-07 LAB — HELIX MOLECULAR SCREEN

## 2022-12-23 DIAGNOSIS — Z124 Encounter for screening for malignant neoplasm of cervix: Secondary | ICD-10-CM | POA: Diagnosis not present

## 2022-12-23 DIAGNOSIS — L309 Dermatitis, unspecified: Secondary | ICD-10-CM | POA: Diagnosis not present

## 2022-12-23 DIAGNOSIS — N941 Unspecified dyspareunia: Secondary | ICD-10-CM | POA: Diagnosis not present

## 2022-12-23 DIAGNOSIS — Z01419 Encounter for gynecological examination (general) (routine) without abnormal findings: Secondary | ICD-10-CM | POA: Diagnosis not present

## 2022-12-23 DIAGNOSIS — R102 Pelvic and perineal pain: Secondary | ICD-10-CM | POA: Diagnosis not present

## 2022-12-23 DIAGNOSIS — Z975 Presence of (intrauterine) contraceptive device: Secondary | ICD-10-CM | POA: Diagnosis not present

## 2023-01-24 ENCOUNTER — Ambulatory Visit: Payer: 59 | Admitting: Internal Medicine

## 2023-02-24 NOTE — H&P (Signed)
Tiffany Clark is an 42 y.o. [id number] who is admitted for Robotic Assisted Total Laparoscopic Hysterectomy and Bilateral Salpingectomy due to chronic pelvic pain.  She has previously considered hysterectomy and would like to proceed now due to cramping/pelvic pain. Previously underwent trial of IUD removal but cramping persisted even with use of Nuvaring. She had Mirena IUD replaced in 2019. Pt had uterine prolapse with her last two pregnancies and states that from that point she has cramping and discomfort with intercourse. Pt reports that at the beginning of the year she started having periods again and has been having severe cramping. She states that she now occasionally has innontinence when sneezing. Pt states that she had c-section with her last pregnancy due to hx of shoulder dystocia with 2 previous pregnancies. She does not desire future fertility. She understands that she may still have persistent pelvic pain, even after hysterectomy.  Work-up: Pap smear (12/23/2022): NILM/HRHPV negative  TVUS (01/01/2022): Uterus: 9.31 x 4.94 x 5.16 cm Endometrial thickness: 0.34 cm Right Ovary 2.83 cm  Left Ovary 2.43cm Anteverted uterus. No uterine anomalies seen.Endometrium thin. IUD seen in proper place. Right ovary- simple follicle 2.2 x 2.0 x 1.9 cm, avascular. Left Ovary WNL.No adnexal masses seen.  Patient Active Problem List   Diagnosis Date Noted   Vitamin D deficiency 07/19/2022   Prediabetes 07/19/2022   Chest pain 07/19/2022   GAD (generalized anxiety disorder) 12/31/2021   Encounter for general adult medical examination with abnormal findings 07/13/2021   Gastroesophageal reflux disease 07/13/2020   Pain in female genitalia on intercourse 07/13/2020   Stress incontinence (female) (female) 07/13/2020   Uterine prolapse 07/13/2020   Mixed hyperlipidemia 07/13/2020   History of nephrolithiasis 01/11/2020   Obesity (BMI 30-39.9) 01/11/2020   Gestational diabetes mellitus in childbirth,  diet controlled    MEDICAL/FAMILY/SOCIAL HX: No LMP recorded. (Menstrual status: IUD).    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 and of unspecified site    Intrauterine normal pregnancy 04/29/2014   Missed ab    x 3 - no surgery required    Other abnormalities in shape or position of gravid uterus and of neighboring structures, antepartum    prolapse during pregnancy   SVD (spontaneous vaginal delivery)    x 3   Threatened abortion in first trimester 07/24/2013   Urinary retention 10/03/2011    Past Surgical History:  Procedure Laterality Date   CARPAL TUNNEL RELEASE     Right  WRIST ONLY   CESAREAN SECTION N/A 04/29/2014   Procedure: CESAREAN SECTION;  Surgeon: Sharon Seller, DO;  Location: [facility];  Service: Obstetrics;  Laterality: N/A;   CESAREAN SECTION N/A    [employer] 01/10/2020   LITHOTRIPSY     x 1   WISDOM TOOTH EXTRACTION      Family History  Adopted: Yes  Problem Relation Age of Onset   Other Neg Hx     Social History:  reports that she quit smoking about 18 years ago. Her smoking use included cigarettes. She started smoking about 24 years ago.  She has a 4.5 pack-year smoking history. She has never used smokeless tobacco. She reports that she does not drink alcohol and does not use drugs.  ALLERGIES/MEDS:  Allergies:  Allergies  Allergen Reactions   Aleve [Naproxen Sodium]     Heart races and sweaty palms. Feels like panic attack. Can take Ibuprofen   Hydrocodone Nausea And Vomiting   Naproxen     Other reaction(s): Unknown   Latex Rash    Other reaction(s): Unknown    No medications prior to admission.      Review of Systems  Constitutional: Negative.   HENT: Negative.    Eyes: Negative.   Respiratory: Negative.    Cardiovascular: Negative.   Gastrointestinal: Negative.   Genitourinary: Negative.   Musculoskeletal: Negative.   Skin: Negative.   Endo/Heme/Allergies: Negative.   Psychiatric/Behavioral: Negative.      unknown if currently breastfeeding. Gen:  NAD, pleasant and cooperative Cardio:  RRR Pulm:  CTAB, no wheezes/rales/rhonchi Abd:  Soft, non-distended, non-tender throughout, no rebound/guarding Ext:  No bilateral LE edema, no bilateral calf tenderness Pelvic: Labia - unremarkable, vagina - pink moist mucosa, no lesions or abnormal discharge, cervix - no discharge or lesions or CMT, adnexa - no masses or tenderness - uterus non-tender and normal size on palpation  No results found for this or any previous visit (from the past 24 hour(s)).  No results found.   ASSESSMENT/PLAN: Tiffany Clark is a 42 y.o. [id number] who is admitted for Robotic Assisted Total Laparoscopic Hysterectomy and Bilateral Salpingectomy due to chronic pelvic pain.  - Admit to [facility] Main OR - Admit labs (CBC, T&S) - Diet:  ERAS pathway/per anesthesia - IVF:  Per anesthesia - VTE Prophylaxis:  SCDs - Antibiotics: Ancef 2g on call to OR - D/C home POD#0-1  Consents: I have explained to the patient that his surgery is performed to remove the uterus through several small incisions in the abdomen and that it will result in sterility.  I discussed the risks and benefits of the surgery, including, but not limited to bleeding, including the need for blood transfusion, infection, damage to surround organs and tissues, damage to bladder, damage to ureters, causing kidney damage, and requiring additional procedures, damage to bowels, resulting in further surgery, postoperative pain, short-term and long-term, scarring on the abdominal wall and intra-abdominally, need for further surgery, need for conversion  to an open procedure, development of an incisional hernia, deep vein thrombosis, and/or pulmonary embolism, wound infection and/or separation, painful intercourse, urinary leakage, ovarian failure, resulting in menopausal symptoms requiring treatment, fistula formation, complications the course of which cannot be predicted or prevented, and death. Patient was consented for blood products.  The patient is aware that bleeding may result in the need for a blood transfusion which includes risk of transmission of HIV (1:2 million), Hepatitis C (1:2 million), and Hepatitis B (1:200 thousand) and transfusion reaction.  Patient voiced understanding of the above risks as well as understanding of indications for blood transfusion  [name], DO

## 2023-02-25 ENCOUNTER — Encounter: Payer: Self-pay | Admitting: Internal Medicine

## 2023-02-25 ENCOUNTER — Other Ambulatory Visit: Payer: Self-pay | Admitting: Internal Medicine

## 2023-02-25 DIAGNOSIS — F321 Major depressive disorder, single episode, moderate: Secondary | ICD-10-CM

## 2023-02-25 NOTE — Telephone Encounter (Signed)
Scheduled

## 2023-03-05 DIAGNOSIS — R102 Pelvic and perineal pain: Secondary | ICD-10-CM | POA: Diagnosis not present

## 2023-03-05 DIAGNOSIS — Z01818 Encounter for other preprocedural examination: Secondary | ICD-10-CM | POA: Diagnosis not present

## 2023-03-07 ENCOUNTER — Telehealth (INDEPENDENT_AMBULATORY_CARE_PROVIDER_SITE_OTHER): Payer: 59 | Admitting: Internal Medicine

## 2023-03-07 ENCOUNTER — Encounter: Payer: Self-pay | Admitting: Internal Medicine

## 2023-03-07 DIAGNOSIS — F411 Generalized anxiety disorder: Secondary | ICD-10-CM

## 2023-03-07 MED ORDER — FLUOXETINE HCL 10 MG PO TABS: ORAL_TABLET | ORAL | 0 refills | Status: DC

## 2023-03-07 NOTE — Patient Instructions (Signed)
Please start taking Prozac 10 mg once daily for 2 weeks and then every other day for 2 weeks.

## 2023-03-07 NOTE — Progress Notes (Signed)
Virtual Visit via Video Note   Because of [name]'s co-morbid illnesses, she is at least at moderate risk for complications without adequate follow up.  This format is felt to be most appropriate for this patient at this time.  All issues noted in this document were discussed and addressed.  A limited physical exam was performed with this format.      Evaluation Performed:  Follow-up visit  Date:  03/07/2023   ID:  Tiffany, Clark 1980/09/20, MRN [id number]  Patient Location: Home Provider Location: Office/Clinic  Participants: Patient Location of Patient: Home Location of Provider: Telehealth Consent was obtain for visit to be over via telehealth. I verified that I am speaking with the correct person using two identifiers.  PCP:  Anabel Halon, MD   Chief Complaint: GAD  History of Present Illness:    Tiffany Clark is a 42 y.o. female with PMH of MDD, nephrolithiasis and gestational diabetes who has a video visit for f/u of GAD and discussing tapering Prozac.  She has been tolerating Prozac for GAD currently.  She has felt better overall.  She states that her stress Work at home have lessened compared to prior.  She is planning to move her job to [employer], which will reduce her commute and also should help with her schedule.  She denies any change in appetite.  Denies any SI or HI currently.  She prefers to wean herself off of Prozac, which is reasonable.  The patient does not have symptoms concerning for COVID-19 infection (fever, chills, cough, or new shortness of breath).   Past Medical, Surgical, Social History, Allergies, and Medications have been Reviewed.  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 elsewhere and of unspecified site    Intrauterine normal pregnancy 04/29/2014   Missed ab    x 3 - no surgery required    Other abnormalities in shape or position of gravid uterus and of neighboring structures, antepartum    prolapse during pregnancy   SVD (spontaneous vaginal delivery)    x 3   Threatened abortion in first trimester 07/24/2013   Urinary retention 10/03/2011   Past Surgical History:  Procedure Laterality Date   CARPAL TUNNEL RELEASE     Right  WRIST ONLY   CESAREAN SECTION N/A 04/29/2014   Procedure: CESAREAN SECTION;  Surgeon: Sharon Seller, DO;  Location: [facility];  Service: Obstetrics;  Laterality: N/A;   CESAREAN SECTION N/A    Phreesia 01/10/2020   LITHOTRIPSY     x 1   WISDOM TOOTH EXTRACTION       No outpatient medications have been marked as taking for the 03/07/23 encounter (Video Visit) with Anabel Halon, MD.     Allergies:   Aleve [naproxen sodium], Hydrocodone, Naproxen, and Latex   ROS:   Please see the history of present illness. All other systems reviewed and are negative.   Labs/Other Tests and Data Reviewed:    Recent Labs: 08/02/2022: ALT 19; BUN 11; Creatinine, Ser 0.85; Hemoglobin  13.5; Platelets 392; Potassium 4.3; Sodium 138; TSH 1.130   Recent Lipid Panel Lab Results  Component Value Date/Time   CHOL 183 08/02/2022 10:01 AM   TRIG 133 08/02/2022 10:01 AM   HDL 36 (L) 08/02/2022 10:01 AM   CHOLHDL 5.1 (H) 08/02/2022 10:01 AM   LDLCALC 123 (H) 08/02/2022 10:01 AM    Wt Readings from Last 3 Encounters:  07/19/22 201 lb 3.2 oz (91.3 kg)  06/03/22 190 lb (86.2 kg)  07/13/21 172 lb 6.4 oz (78.2 kg)     Objective:    Vital Signs:  There were no vitals taken for this visit.   VITAL SIGNS:  reviewed GEN:  no acute distress EYES:  sclerae anicteric, EOMI - Extraocular Movements Intact RESPIRATORY:  normal respiratory effort, symmetric  expansion NEURO:  alert and oriented x 3, no obvious focal deficit PSYCH:  normal affect  ASSESSMENT & PLAN:    GAD (generalized anxiety disorder) Well-controlled with Prozac to 20 mg QD As she prefers to wean herself off of Prozac, will start tapering over next 4 weeks - Prozac 10 mg once daily for 2 weeks and then every other day for another 2 weeks, then discontinue Advised to contact if any new symptoms such as worsening of anxiety or mood variation, although less likely as she has taken it only for 1 year  I discussed the assessment and treatment plan with the patient. The patient was provided an opportunity to ask questions, and all were answered. The patient agreed with the plan and demonstrated an understanding of the instructions.   The patient was advised to call back or seek an in-person evaluation if the symptoms worsen or if the condition fails to improve as anticipated.  The above assessment and management plan was discussed with the patient. The patient verbalized understanding of and has agreed to the management plan.   Medication Adjustments/Labs and Tests Ordered: Current medicines are reviewed at length with the patient today.  Concerns regarding medicines are outlined above.   Tests Ordered: No orders of the defined types were placed in this encounter.   Medication Changes: No orders of the defined types were placed in this encounter.    Note: This dictation was prepared with Dragon dictation along with smaller phrase technology. Similar sounding words can be transcribed inadequately or may not be corrected upon review. Any transcriptional errors that result from this process are unintentional.      Disposition:  Follow up  Signed, Anabel Halon, MD  03/07/2023 8:32 AM     [facility]

## 2023-03-07 NOTE — Pre-Procedure Instructions (Signed)
Surgical Instructions   Your procedure is scheduled on Wednesday, December 11th. Report to [facility] Main Entrance "A" at 10:00 A.M., then check in with the Admitting office. Any questions or running late day of surgery: call [phone]  Questions prior to your surgery date: call [phone], Monday-Friday, 8am-4pm. If you experience any cold or flu symptoms such as cough, fever, chills, shortness of breath, etc. between now and your scheduled surgery, please notify us at the above number.     Remember:  Do not eat after midnight the night before your surgery   You may drink clear liquids until 09:00 AM the morning of your surgery.   Clear liquids allowed are: Water, Non-Citrus Juices (without pulp), Carbonated Beverages, Clear Tea (no milk, honey, etc.), Black Coffee Only (NO MILK, CREAM OR POWDERED CREAMER of any kind), and Gatorade.    Take these medicines the morning of surgery with A SIP OF WATER  FLUoxetine (PROZAC)      One week prior to surgery, STOP taking any Aspirin (unless otherwise instructed by your surgeon) Aleve, Naproxen, Ibuprofen, Motrin, Advil, Goody's, BC's, all herbal medications, fish oil, and non-prescription vitamins.                     Do NOT Smoke (Tobacco/Vaping) for 24 hours prior to your procedure.  If you use a CPAP at night, you may bring your mask/headgear for your overnight stay.   You will be asked to remove any contacts, glasses, piercing's, hearing aid's, dentures/partials prior to surgery. Please bring cases for these items if needed.    Patients discharged the day of surgery will not be allowed to drive home, and someone needs to stay with them for 24 hours.  SURGICAL WAITING ROOM VISITATION Patients may have no more than 2 support people in the waiting area - these visitors may rotate.   Pre-op nurse will coordinate an appropriate time for 1 ADULT support person, who may not rotate, to accompany patient in pre-op.  Children under the  age of 55 must have an adult with them who is not the patient and must remain in the main waiting area with an adult.  If the patient needs to stay at the hospital during part of their recovery, the visitor guidelines for inpatient rooms apply.  Please refer to the [facility] website for the visitor guidelines for any additional information.   If you received a COVID test during your pre-op visit  it is requested that you wear a mask when out in public, stay away from anyone that may not be feeling well and notify your surgeon if you develop symptoms. If you have been in contact with anyone that has tested positive in the last 10 days please notify you surgeon.      Pre-operative CHG Bathing Instructions   You can play a key role in reducing the risk of infection after surgery. Your skin needs to be as free of germs as possible. You can reduce the number of germs on your skin by washing with CHG (chlorhexidine gluconate) soap before surgery. CHG is an antiseptic soap that kills germs and continues to kill germs even after washing.   DO NOT use if you have an allergy to chlorhexidine/CHG or antibacterial soaps. If your skin becomes reddened or irritated, stop using the CHG and notify one of our RNs at [phone].              TAKE A SHOWER THE NIGHT BEFORE SURGERY  AND THE DAY OF SURGERY    Please keep in mind the following:  DO NOT shave, including legs and underarms, 48 hours prior to surgery.   You may shave your face before/day of surgery.  Place clean sheets on your bed the night before surgery Use a clean washcloth (not used since being washed) for each shower. DO NOT sleep with pet's night before surgery.  CHG Shower Instructions:  Wash your face and private area with normal soap. If you choose to wash your hair, wash first with your normal shampoo.  After you use shampoo/soap, rinse your hair and body thoroughly to remove shampoo/soap residue.  Turn the water OFF and apply half  the bottle of CHG soap to a CLEAN washcloth.  Apply CHG soap ONLY FROM YOUR NECK DOWN TO YOUR TOES (washing for 3-5 minutes)  DO NOT use CHG soap on face, private areas, open wounds, or sores.  Pay special attention to the area where your surgery is being performed.  If you are having back surgery, having someone wash your back for you may be helpful. Wait 2 minutes after CHG soap is applied, then you may rinse off the CHG soap.  Pat dry with a clean towel  Put on clean pajamas    Additional instructions for the day of surgery: DO NOT APPLY any lotions, deodorants, cologne, or perfumes.   Do not wear jewelry or makeup Do not wear nail polish, gel polish, artificial nails, or any other type of covering on natural nails (fingers and toes) Do not bring valuables to the hospital. [facility] is not responsible for valuables/personal belongings. Put on clean/comfortable clothes.  Please brush your teeth.  Ask your nurse before applying any prescription medications to the skin.

## 2023-03-07 NOTE — Assessment & Plan Note (Signed)
Well-controlled with Prozac to 20 mg QD As she prefers to wean herself off of Prozac, will start tapering over next 4 weeks - Prozac 10 mg once daily for 2 weeks and then every other day for another 2 weeks, then discontinue Advised to contact if any new symptoms such as worsening of anxiety or mood variation, although less likely as she has taken it only for 1 year

## 2023-03-10 ENCOUNTER — Encounter (HOSPITAL_COMMUNITY)
Admission: RE | Admit: 2023-03-10 | Discharge: 2023-03-10 | Disposition: A | Discharge status: Home / Self Care | Payer: 59 | Source: Ambulatory Visit | Attending: Obstetrics and Gynecology | Admitting: Obstetrics and Gynecology

## 2023-03-10 ENCOUNTER — Other Ambulatory Visit: Payer: Self-pay

## 2023-03-10 ENCOUNTER — Encounter (HOSPITAL_COMMUNITY): Payer: Self-pay

## 2023-03-10 VITALS — BP 117/74 | HR 85 | Temp 98.4°F | Resp 18 | Ht 63.0 in | Wt 201.2 lb

## 2023-03-10 DIAGNOSIS — R102 Pelvic and perineal pain: Secondary | ICD-10-CM | POA: Insufficient documentation

## 2023-03-10 DIAGNOSIS — Z01812 Encounter for preprocedural laboratory examination: Secondary | ICD-10-CM | POA: Diagnosis not present

## 2023-03-10 DIAGNOSIS — Z01818 Encounter for other preprocedural examination: Secondary | ICD-10-CM

## 2023-03-10 LAB — CBC
HCT: 40.1 % (ref 36.0–46.0)
Hemoglobin: 13.3 g/dL (ref 12.0–15.0)
MCH: 30 pg (ref 26.0–34.0)
MCHC: 33.2 g/dL (ref 30.0–36.0)
MCV: 90.5 fL (ref 80.0–100.0)
Platelets: 410 10*3/uL — ABNORMAL HIGH (ref 150–400)
RBC: 4.43 MIL/uL (ref 3.87–5.11)
RDW: 12.4 % (ref 11.5–15.5)
WBC: 9.1 10*3/uL (ref 4.0–10.5)
nRBC: 0 % (ref 0.0–0.2)

## 2023-03-10 LAB — BASIC METABOLIC PANEL
Anion gap: 9 (ref 5–15)
BUN: 14 mg/dL (ref 6–20)
CO2: 23 mmol/L (ref 22–32)
Calcium: 9.2 mg/dL (ref 8.9–10.3)
Chloride: 105 mmol/L (ref 98–111)
Creatinine, Ser: 0.92 mg/dL (ref 0.44–1.00)
GFR, Estimated: >60 mL/min (ref >60)
Glucose, Bld: 114 mg/dL — ABNORMAL HIGH (ref 70–99)
Potassium: 3.7 mmol/L (ref 3.5–5.1)
Sodium: 137 mmol/L (ref 135–145)

## 2023-03-10 NOTE — Progress Notes (Signed)
PCP - Rutwik Patel,MD Cardiologist -denies   PPM/ICD - denies Device Orders -  Rep Notified -   Chest x-ray - na EKG - 06/03/22 Stress Test - denies ECHO - denies Cardiac Cath - denies  Sleep Study - denies CPAP -   Fasting Blood Sugar - na Checks Blood Sugar _____ times a day  Last dose of GLP1 agonist-  na GLP1 instructions:   Blood Thinner Instructions:na Aspirin Instructions:na  ERAS Protcol -clears until 0900 PRE-SURGERY Ensure or G2- no  COVID TEST- na   Anesthesia review: no  Patient denies shortness of breath, fever, cough and chest pain at PAT appointment   All instructions explained to the patient, with a verbal understanding of the material. Patient agrees to go over the instructions while at home for a better understanding. Patient also instructed to wear a mask when out in public priot to surgery. The opportunity to ask questions was provided.

## 2023-03-11 LAB — TYPE AND SCREEN
ABO/RH(D): A POS
Antibody Screen: NEGATIVE

## 2023-03-12 ENCOUNTER — Ambulatory Visit (HOSPITAL_COMMUNITY)
Admission: RE | Admit: 2023-03-12 | Discharge: 2023-03-13 | Disposition: A | Discharge status: Home / Self Care | Payer: 59 | Attending: Obstetrics and Gynecology | Admitting: Obstetrics and Gynecology

## 2023-03-12 ENCOUNTER — Encounter (HOSPITAL_COMMUNITY): Payer: Self-pay | Admitting: Obstetrics and Gynecology

## 2023-03-12 ENCOUNTER — Ambulatory Visit (HOSPITAL_BASED_OUTPATIENT_CLINIC_OR_DEPARTMENT_OTHER): Payer: 59

## 2023-03-12 ENCOUNTER — Ambulatory Visit (HOSPITAL_COMMUNITY): Payer: 59

## 2023-03-12 ENCOUNTER — Encounter (HOSPITAL_COMMUNITY)
Admission: RE | Disposition: A | Discharge status: Home / Self Care | Payer: Self-pay | Source: Home / Self Care | Attending: Obstetrics and Gynecology

## 2023-03-12 ENCOUNTER — Other Ambulatory Visit (HOSPITAL_COMMUNITY): Payer: Self-pay

## 2023-03-12 ENCOUNTER — Other Ambulatory Visit: Payer: Self-pay

## 2023-03-12 DIAGNOSIS — Z6835 Body mass index (BMI) 35.0-35.9, adult: Secondary | ICD-10-CM | POA: Diagnosis not present

## 2023-03-12 DIAGNOSIS — E782 Mixed hyperlipidemia: Secondary | ICD-10-CM | POA: Insufficient documentation

## 2023-03-12 DIAGNOSIS — Z87442 Personal history of urinary calculi: Secondary | ICD-10-CM | POA: Insufficient documentation

## 2023-03-12 DIAGNOSIS — Z793 Long term (current) use of hormonal contraceptives: Secondary | ICD-10-CM | POA: Insufficient documentation

## 2023-03-12 DIAGNOSIS — J45909 Unspecified asthma, uncomplicated: Secondary | ICD-10-CM | POA: Diagnosis not present

## 2023-03-12 DIAGNOSIS — Z87891 Personal history of nicotine dependence: Secondary | ICD-10-CM | POA: Diagnosis not present

## 2023-03-12 DIAGNOSIS — F419 Anxiety disorder, unspecified: Secondary | ICD-10-CM | POA: Diagnosis not present

## 2023-03-12 DIAGNOSIS — R102 Pelvic and perineal pain: Secondary | ICD-10-CM | POA: Insufficient documentation

## 2023-03-12 DIAGNOSIS — E669 Obesity, unspecified: Secondary | ICD-10-CM | POA: Diagnosis not present

## 2023-03-12 DIAGNOSIS — K219 Gastro-esophageal reflux disease without esophagitis: Secondary | ICD-10-CM | POA: Diagnosis not present

## 2023-03-12 DIAGNOSIS — R32 Unspecified urinary incontinence: Secondary | ICD-10-CM | POA: Insufficient documentation

## 2023-03-12 DIAGNOSIS — R519 Headache, unspecified: Secondary | ICD-10-CM | POA: Insufficient documentation

## 2023-03-12 DIAGNOSIS — G8929 Other chronic pain: Secondary | ICD-10-CM | POA: Diagnosis present

## 2023-03-12 DIAGNOSIS — R7303 Prediabetes: Secondary | ICD-10-CM | POA: Insufficient documentation

## 2023-03-12 DIAGNOSIS — N858 Other specified noninflammatory disorders of uterus: Secondary | ICD-10-CM | POA: Diagnosis not present

## 2023-03-12 DIAGNOSIS — N814 Uterovaginal prolapse, unspecified: Secondary | ICD-10-CM | POA: Insufficient documentation

## 2023-03-12 DIAGNOSIS — N736 Female pelvic peritoneal adhesions (postinfective): Secondary | ICD-10-CM | POA: Insufficient documentation

## 2023-03-12 HISTORY — PX: ROBOTIC ASSISTED TOTAL HYSTERECTOMY WITH BILATERAL SALPINGO OOPHERECTOMY: SHX6086

## 2023-03-12 LAB — POCT PREGNANCY, URINE: Preg Test, Ur: NEGATIVE

## 2023-03-12 SURGERY — HYSTERECTOMY, TOTAL, ROBOT-ASSISTED, LAPAROSCOPIC, WITH BILATERAL SALPINGO-OOPHORECTOMY
Anesthesia: General | Laterality: Bilateral

## 2023-03-12 MED ORDER — DEXAMETHASONE SODIUM PHOSPHATE 10 MG/ML IJ SOLN
INTRAMUSCULAR | Status: AC
  Filled 2023-03-12: qty 1

## 2023-03-12 MED ORDER — ACETAMINOPHEN 10 MG/ML IV SOLN
INTRAVENOUS | Status: AC
  Filled 2023-03-12: qty 100

## 2023-03-12 MED ORDER — OXYCODONE HCL 5 MG/5ML PO SOLN: 5.0000 mg | Freq: Once | ORAL | Status: DC | PRN

## 2023-03-12 MED ORDER — ONDANSETRON HCL 4 MG/2ML IJ SOLN
INTRAMUSCULAR | Status: AC
  Filled 2023-03-12: qty 2

## 2023-03-12 MED ORDER — 0.9 % SODIUM CHLORIDE (POUR BTL) OPTIME
TOPICAL | Status: DC | PRN
  Administered 2023-03-12: 1000 mL

## 2023-03-12 MED ORDER — FENTANYL CITRATE (PF) 250 MCG/5ML IJ SOLN
INTRAMUSCULAR | Status: DC | PRN
  Administered 2023-03-12: 150 ug via INTRAVENOUS

## 2023-03-12 MED ORDER — PROPOFOL 10 MG/ML IV BOLUS
INTRAVENOUS | Status: DC | PRN
  Administered 2023-03-12 (×2): 50 mg via INTRAVENOUS
  Administered 2023-03-12: 150 mg via INTRAVENOUS

## 2023-03-12 MED ORDER — MENTHOL 3 MG MT LOZG: 1.0000 | LOZENGE | OROMUCOSAL | Status: DC | PRN

## 2023-03-12 MED ORDER — IBUPROFEN 800 MG PO TABS
800.0000 mg | ORAL_TABLET | Freq: Three times a day (TID) | ORAL | 0 refills | Status: AC | PRN
Start: 2023-03-12 — End: ?
  Filled 2023-03-12: qty 30, 10d supply, fill #0

## 2023-03-12 MED ORDER — DOCUSATE SODIUM 100 MG PO CAPS
100.0000 mg | ORAL_CAPSULE | Freq: Two times a day (BID) | ORAL | Status: DC
  Administered 2023-03-12: 100 mg via ORAL
  Filled 2023-03-12: qty 1

## 2023-03-12 MED ORDER — IBUPROFEN 800 MG PO TABS: 800.0000 mg | ORAL_TABLET | Freq: Three times a day (TID) | ORAL | Status: DC | PRN

## 2023-03-12 MED ORDER — ORAL CARE MOUTH RINSE: 15.0000 mL | Freq: Once | OROMUCOSAL | Status: AC

## 2023-03-12 MED ORDER — DEXAMETHASONE SODIUM PHOSPHATE 10 MG/ML IJ SOLN
INTRAMUSCULAR | Status: DC | PRN
  Administered 2023-03-12: 10 mg via INTRAVENOUS

## 2023-03-12 MED ORDER — LACTATED RINGERS IV SOLN: INTRAVENOUS | Status: DC

## 2023-03-12 MED ORDER — ROCURONIUM BROMIDE 10 MG/ML (PF) SYRINGE
PREFILLED_SYRINGE | INTRAVENOUS | Status: AC
  Filled 2023-03-12: qty 10

## 2023-03-12 MED ORDER — OXYCODONE HCL 5 MG PO TABS: 5.0000 mg | ORAL_TABLET | Freq: Once | ORAL | Status: DC | PRN

## 2023-03-12 MED ORDER — SODIUM CHLORIDE 0.9 % IV SOLN
INTRAVENOUS | Status: DC | PRN
  Administered 2023-03-12: 90 mL

## 2023-03-12 MED ORDER — TRAMADOL HCL 50 MG PO TABS
50.0000 mg | ORAL_TABLET | Freq: Four times a day (QID) | ORAL | Status: DC | PRN
  Administered 2023-03-12: 50 mg via ORAL
  Filled 2023-03-12: qty 1

## 2023-03-12 MED ORDER — PROPOFOL 10 MG/ML IV BOLUS
INTRAVENOUS | Status: AC
  Filled 2023-03-12: qty 20

## 2023-03-12 MED ORDER — ACETAMINOPHEN 500 MG PO TABS
1000.0000 mg | ORAL_TABLET | Freq: Four times a day (QID) | ORAL | Status: DC
  Administered 2023-03-12 – 2023-03-13 (×2): 1000 mg via ORAL
  Filled 2023-03-12 (×2): qty 2

## 2023-03-12 MED ORDER — FENTANYL CITRATE (PF) 250 MCG/5ML IJ SOLN
INTRAMUSCULAR | Status: AC
  Filled 2023-03-12: qty 5

## 2023-03-12 MED ORDER — CHLORHEXIDINE GLUCONATE 0.12 % MT SOLN
15.0000 mL | Freq: Once | OROMUCOSAL | Status: AC
  Administered 2023-03-12: 15 mL via OROMUCOSAL
  Filled 2023-03-12: qty 15

## 2023-03-12 MED ORDER — TRAMADOL HCL 50 MG PO TABS
50.0000 mg | ORAL_TABLET | Freq: Four times a day (QID) | ORAL | 0 refills | Status: DC | PRN
  Filled 2023-03-12: qty 20, 5d supply, fill #0

## 2023-03-12 MED ORDER — LIDOCAINE 2% (20 MG/ML) 5 ML SYRINGE
INTRAMUSCULAR | Status: DC | PRN
  Administered 2023-03-12: 100 mg via INTRAVENOUS

## 2023-03-12 MED ORDER — ONDANSETRON HCL 4 MG PO TABS: 4.0000 mg | ORAL_TABLET | Freq: Four times a day (QID) | ORAL | Status: DC | PRN

## 2023-03-12 MED ORDER — LACTATED RINGERS IV SOLN: INTRAVENOUS | Status: DC | PRN

## 2023-03-12 MED ORDER — ROCURONIUM BROMIDE 10 MG/ML (PF) SYRINGE
PREFILLED_SYRINGE | INTRAVENOUS | Status: DC | PRN
  Administered 2023-03-12: 60 mg via INTRAVENOUS
  Administered 2023-03-12: 20 mg via INTRAVENOUS
  Administered 2023-03-12: 10 mg via INTRAVENOUS

## 2023-03-12 MED ORDER — FENTANYL CITRATE (PF) 100 MCG/2ML IJ SOLN
25.0000 ug | INTRAMUSCULAR | Status: DC | PRN
Start: 2023-03-12 — End: 2023-03-12
  Administered 2023-03-12: 25 ug via INTRAVENOUS
  Administered 2023-03-12: 50 ug via INTRAVENOUS
  Administered 2023-03-12: 25 ug via INTRAVENOUS

## 2023-03-12 MED ORDER — MIDAZOLAM HCL 2 MG/2ML IJ SOLN
INTRAMUSCULAR | Status: DC | PRN
  Administered 2023-03-12: 2 mg via INTRAVENOUS

## 2023-03-12 MED ORDER — ACETAMINOPHEN 10 MG/ML IV SOLN
1000.0000 mg | Freq: Once | INTRAVENOUS | Status: DC | PRN
  Administered 2023-03-12: 1000 mg via INTRAVENOUS

## 2023-03-12 MED ORDER — ONDANSETRON HCL 4 MG/2ML IJ SOLN
4.0000 mg | Freq: Four times a day (QID) | INTRAMUSCULAR | Status: DC | PRN
  Administered 2023-03-12: 4 mg via INTRAVENOUS
  Filled 2023-03-12: qty 2

## 2023-03-12 MED ORDER — SODIUM CHLORIDE (PF) 0.9 % IJ SOLN
INTRAMUSCULAR | Status: AC
Start: 2023-03-12 — End: ?
  Filled 2023-03-12: qty 50

## 2023-03-12 MED ORDER — SIMETHICONE 80 MG PO CHEW
80.0000 mg | CHEWABLE_TABLET | Freq: Four times a day (QID) | ORAL | Status: DC | PRN
  Administered 2023-03-12: 80 mg via ORAL
  Filled 2023-03-12: qty 1

## 2023-03-12 MED ORDER — ONDANSETRON HCL 4 MG/2ML IJ SOLN
INTRAMUSCULAR | Status: DC | PRN
  Administered 2023-03-12: 4 mg via INTRAVENOUS

## 2023-03-12 MED ORDER — LIDOCAINE 2% (20 MG/ML) 5 ML SYRINGE
INTRAMUSCULAR | Status: AC
  Filled 2023-03-12: qty 5

## 2023-03-12 MED ORDER — CEFAZOLIN SODIUM-DEXTROSE 2-4 GM/100ML-% IV SOLN
2.0000 g | INTRAVENOUS | Status: AC
  Administered 2023-03-12: 2 g via INTRAVENOUS
  Filled 2023-03-12: qty 100

## 2023-03-12 MED ORDER — MIDAZOLAM HCL 2 MG/2ML IJ SOLN
INTRAMUSCULAR | Status: AC
  Filled 2023-03-12: qty 2

## 2023-03-12 MED ORDER — FENTANYL CITRATE (PF) 100 MCG/2ML IJ SOLN
INTRAMUSCULAR | Status: AC
  Filled 2023-03-12: qty 2

## 2023-03-12 MED ORDER — DROPERIDOL 2.5 MG/ML IJ SOLN: 0.6250 mg | Freq: Once | INTRAMUSCULAR | Status: DC | PRN

## 2023-03-12 MED ORDER — SUGAMMADEX SODIUM 200 MG/2ML IV SOLN
INTRAVENOUS | Status: DC | PRN
  Administered 2023-03-12: 300 mg via INTRAVENOUS

## 2023-03-12 MED ORDER — MORPHINE SULFATE (PF) 2 MG/ML IV SOLN
1.0000 mg | INTRAVENOUS | Status: DC | PRN
  Administered 2023-03-12: 1 mg via INTRAVENOUS
  Filled 2023-03-12: qty 1

## 2023-03-12 SURGICAL SUPPLY — 60 items
APPLICATOR ARISTA FLEXITIP XL (MISCELLANEOUS) IMPLANT
BARRIER ADHS 3X4 INTERCEED (GAUZE/BANDAGES/DRESSINGS) IMPLANT
CANNULA CAP OBTURATR AIRSEAL 8 (CAP) ×1 IMPLANT
COVER BACK TABLE 60X90IN (DRAPES) ×1 IMPLANT
COVER TIP SHEARS 8 DVNC (MISCELLANEOUS) ×1 IMPLANT
DEFOGGER SCOPE WARMER CLEARIFY (MISCELLANEOUS) ×1 IMPLANT
DERMABOND ADVANCED .7 DNX12 (GAUZE/BANDAGES/DRESSINGS) ×1 IMPLANT
DILATOR CANAL MILEX (MISCELLANEOUS) IMPLANT
DRAPE ARM DVNC X/XI (DISPOSABLE) ×4 IMPLANT
DRAPE COLUMN DVNC XI (DISPOSABLE) ×1 IMPLANT
DRAPE UTILITY W/TAPE 26X15 (DRAPES) ×1 IMPLANT
DRIVER NDL MEGA 8 DVNC XI (INSTRUMENTS) ×1 IMPLANT
DRIVER NDLE MEGA DVNC XI (INSTRUMENTS) ×1 IMPLANT
DURAPREP 26ML APPLICATOR (WOUND CARE) ×1 IMPLANT
ELECT REM PT RETURN 9FT ADLT (ELECTROSURGICAL) ×1 IMPLANT
ELECTRODE REM PT RTRN 9FT ADLT (ELECTROSURGICAL) ×1 IMPLANT
FORCEPS BPLR LNG DVNC XI (INSTRUMENTS) ×1 IMPLANT
FORCEPS LONG TIP 8 DVNC XI (FORCEP) ×1 IMPLANT
FORCEPS PROGRASP DVNC XI (FORCEP) ×1 IMPLANT
GAUZE 4X4 16PLY ~~LOC~~+RFID DBL (SPONGE) IMPLANT
GLOVE BIO SURGEON STRL SZ 6.5 (GLOVE) ×3 IMPLANT
GLOVE BIOGEL PI IND STRL 7.0 (GLOVE) ×5 IMPLANT
HEMOSTAT ARISTA ABSORB 3G PWDR (HEMOSTASIS) IMPLANT
HIBICLENS CHG 4% 4OZ BTL (MISCELLANEOUS) ×2 IMPLANT
IRRIGATION STRYKERFLOW (MISCELLANEOUS) ×1 IMPLANT
IRRIGATOR STRYKERFLOW (MISCELLANEOUS) ×1 IMPLANT
KIT PINK PAD W/HEAD ARE REST (MISCELLANEOUS) ×1 IMPLANT
KIT PINK PAD W/HEAD ARM REST (MISCELLANEOUS) ×1 IMPLANT
LEGGING LITHOTOMY PAIR STRL (DRAPES) ×1 IMPLANT
OBTURATOR OPTICAL STND 8 DVNC (TROCAR) ×1 IMPLANT
OBTURATOR OPTICALSTD 8 DVNC (TROCAR) ×1 IMPLANT
OCCLUDER COLPOPNEUMO (BALLOONS) ×1 IMPLANT
PACK ROBOT WH (CUSTOM PROCEDURE TRAY) ×1 IMPLANT
PACK ROBOTIC GOWN (GOWN DISPOSABLE) ×1 IMPLANT
PAD OB MATERNITY 4.3X12.25 (PERSONAL CARE ITEMS) ×1 IMPLANT
POWDER SURGICEL 3.0 GRAM (HEMOSTASIS) IMPLANT
PROTECTOR NERVE ULNAR (MISCELLANEOUS) ×1 IMPLANT
RETRACTOR WND ALEXIS 18 MED (MISCELLANEOUS) IMPLANT
RTRCTR WOUND ALEXIS 18CM MED (MISCELLANEOUS) IMPLANT
RTRCTR WOUND ALEXIS 18CM SML (INSTRUMENTS) IMPLANT
SAVER CELL AAL HAEMONETICS (INSTRUMENTS) IMPLANT
SCISSORS MNPLR CVD DVNC XI (INSTRUMENTS) ×1 IMPLANT
SEAL UNIV 5-12 XI (MISCELLANEOUS) ×4 IMPLANT
SEALER VESSEL EXT DVNC XI (MISCELLANEOUS) ×1 IMPLANT
SET IRRIG Y TYPE TUR BLADDER L (SET/KITS/TRAYS/PACK) IMPLANT
SET TUBE FILTERED XL AIRSEAL (SET/KITS/TRAYS/PACK) ×1 IMPLANT
SPIKE FLUID TRANSFER (MISCELLANEOUS) ×4 IMPLANT
SUT VIC AB 0 CT1 27XBRD ANBCTR (SUTURE) ×2 IMPLANT
SUT VICRYL 0 UR6 27IN ABS (SUTURE) IMPLANT
SUT VICRYL RAPIDE 4/0 PS 2 (SUTURE) ×2 IMPLANT
SUT VLOC 180 0 9IN GS21 (SUTURE) ×1 IMPLANT
TIP ENDOSCOPIC SURGICEL (TIP) IMPLANT
TIP RUMI ORANGE 6.7MMX12CM (TIP) IMPLANT
TIP UTERINE 5.1X6CM LAV DISP (MISCELLANEOUS) IMPLANT
TIP UTERINE 6.7X10CM GRN DISP (MISCELLANEOUS) IMPLANT
TIP UTERINE 6.7X6CM WHT DISP (MISCELLANEOUS) IMPLANT
TIP UTERINE 6.7X8CM BLUE DISP (MISCELLANEOUS) IMPLANT
TOWEL GREEN STERILE (TOWEL DISPOSABLE) ×1 IMPLANT
TRAY FOLEY MTR SLVR 14FR STAT (SET/KITS/TRAYS/PACK) ×1 IMPLANT
UNDERPAD 30X36 HEAVY ABSORB (UNDERPADS AND DIAPERS) ×1 IMPLANT

## 2023-03-12 NOTE — Progress Notes (Signed)
Patient seen at bedside. Reports some nausea and vomited her pain medication. Has not been able to eat yet and has had some gas pain. Desires to stay overnight. Encouraged to request IV pain medication if needed.  [name], DO

## 2023-03-12 NOTE — Anesthesia Postprocedure Evaluation (Signed)
Anesthesia Post Note  Patient: Tiffany Clark  Procedure(s) Performed: XI ROBOTIC ASSISTED TOTAL HYSTERECTOMY WITH BILATERAL SALPINGO (Bilateral)     Patient location during evaluation: PACU Anesthesia Type: General Level of consciousness: awake and alert Pain management: pain level controlled Vital Signs Assessment: post-procedure vital signs reviewed and stable Respiratory status: spontaneous breathing, nonlabored ventilation, respiratory function stable and patient connected to nasal cannula oxygen Cardiovascular status: blood pressure returned to baseline and stable Postop Assessment: no apparent nausea or vomiting Anesthetic complications: no   No notable events documented.  Last Vitals:  Vitals:   03/12/23 1500 03/12/23 1515  BP: 116/69 111/71  Pulse: 76 93  Resp: 15 13  Temp:    SpO2: 98% 96%    Last Pain:  Vitals:   03/12/23 1447  TempSrc:   PainSc: Asleep                 Burnett Nation

## 2023-03-12 NOTE — Interval H&P Note (Signed)
History and Physical Interval Note:  03/12/2023 11:17 AM  Tiffany Clark  has presented today for surgery, with the diagnosis of Pelvic Pain.  The various methods of treatment have been discussed with the patient and family. After consideration of risks, benefits and other options for treatment, the patient has consented to  Procedure(s): XI ROBOTIC ASSISTED TOTAL HYSTERECTOMY WITH BILATERAL SALPINGECTOMY (Bilateral) as a surgical intervention.  The patient's history has been reviewed, patient examined, no change in status, stable for surgery.  I have reviewed the patient's chart and labs.  Questions were answered to the patient's satisfaction.     [name]

## 2023-03-12 NOTE — Transfer of Care (Signed)
Immediate Anesthesia Transfer of Care Note  Patient: Tiffany Clark  Procedure(s) Performed: XI ROBOTIC ASSISTED TOTAL HYSTERECTOMY WITH BILATERAL SALPINGO (Bilateral)  Patient Location: PACU  Anesthesia Type:General  Level of Consciousness: drowsy, patient cooperative, and responds to stimulation  Airway & Oxygen Therapy: Patient Spontanous Breathing and Patient connected to face mask oxygen  Post-op Assessment: Report given to RN, Post -op Vital signs reviewed and stable, and Patient moving all extremities X 4  Post vital signs: Reviewed and stable  Last Vitals:  Vitals Value Taken Time  BP 117/64 03/12/23 1447  Temp 37.2 C 03/12/23 1447  Pulse 75 03/12/23 1451  Resp 17 03/12/23 1451  SpO2 98 % 03/12/23 1451  Vitals shown include unfiled device data.  Last Pain:  Vitals:   03/12/23 1011  TempSrc:   PainSc: 3       Patients Stated Pain Goal: 0 (03/12/23 1011)  Complications: No notable events documented.

## 2023-03-12 NOTE — Discharge Summary (Signed)
Physician Discharge Summary  Patient ID: Tiffany Clark MRN: [id number] DOB/AGE: 1980-12-05 42 y.o.  Admit date: 03/12/2023 Discharge date: 03/13/2023  Admission Diagnoses: Chronic pelvic pain History of uterine prolapse  Discharge Diagnoses:  Principal Problem:   Pelvic pain Active Problems:   Chronic pelvic pain in female History of uterine prolapse  Procedure(s): Robotic Assisted Total Laparoscopic Hysterectomy and Bilateral Salpingectomy   Discharged Condition: good  Hospital Course: Patient was admitted on 03/12/2023 for the above named procedure(s) for the above named diagnoses. Prior to hospital discharge, patient was tolerating PO, ambulating, voiding spontaneously, and pain was well-controlled. See hospital chart for specific details. Patient was discharged home in stable condition.  Consults: None  Significant Diagnostic Studies: None  Treatments: Surgery - as above  Discharge Exam: Blood pressure (!) 106/51, pulse 71, temperature 98.2 F (36.8 C), temperature source Oral, resp. rate 16, height 5\' 3"  (1.6 m), weight 90.7 kg, SpO2 97%, unknown if currently breastfeeding. Gen:  NAD, pleasant and cooperative Cardio:  Regular rate Pulm:  Normal work of breathing Abd:  Soft, non-distended, non-tender throughout, no rebound/guarding, laparoscopic port sites with skin glue atop and clean/dry/intact Ext:  No bilateral LE edema, no bilateral calf tenderness  Disposition: Discharge disposition: 01-Home or Self Care       Discharge Instructions     Call MD for:   Complete by: As directed    Spotting to very light bleeding is normal as the vaginal cuff is healing. Call with any heavy vaginal bleeding (filling up more than one large pad in an hour).   Call MD for:  difficulty breathing, headache or visual disturbances   Complete by: As directed    Call MD for:  extreme fatigue   Complete by: As directed    Call MD for:  hives   Complete by: As directed    Call  MD for:  persistant dizziness or light-headedness   Complete by: As directed    Call MD for:  persistant nausea and vomiting   Complete by: As directed    Call MD for:  redness, tenderness, or signs of infection (pain, swelling, redness, odor or green/yellow discharge around incision site)   Complete by: As directed    Call MD for:  severe uncontrolled pain   Complete by: As directed    Call MD for:  temperature >100.4   Complete by: As directed    Diet - low sodium heart healthy   Complete by: As directed    Driving Restrictions   Complete by: As directed    No driving for at least 2 weeks.   Increase activity slowly   Complete by: As directed    Lifting restrictions   Complete by: As directed    No lifting greater than 10lbs for 6 weeks.   Other Restrictions   Complete by: As directed    No baths or submersion in water for at least 6 weeks.   Sexual Activity Restrictions   Complete by: As directed    No sexual intercourse or objects in the vagina for at least 6 weeks.   meds to beds pharmacy consult ([facility] ONLY)   Complete by: As directed       Allergies as of 03/13/2023       Reactions   Aleve [naproxen Sodium]    Heart races and sweaty palms. Feels like panic attack. Can take Ibuprofen   Hydrocodone Nausea And Vomiting   Naproxen    Other reaction(s): Unknown   Latex  Rash   Other reaction(s): Unknown        Medication List     STOP taking these medications    Mirena (52 MG) 20 MCG/24HR Iud Generic drug: levonorgestrel       TAKE these medications    FLUoxetine 10 MG tablet Commonly known as: PROZAC Take 1 tablet (10 mg total) by mouth daily for 14 days, THEN 1 tablet (10 mg total) every other day for 12 days. Start taking on: March 07, 2023   ibuprofen 800 MG tablet Commonly known as: ADVIL Take 1 tablet (800 mg total) by mouth every 8 (eight) hours as needed.   traMADol 50 MG tablet Commonly known as: Ultram Take 1 tablet (50 mg  total) by mouth every 6 (six) hours as needed for severe pain (pain score 7-10).   Vitamin D3 50 MCG (2000 UT) capsule Take 2,000 Units by mouth daily.        Follow-up Information     [name], DO Follow up in 2 week(s).   Specialty: Obstetrics and Gynecology Why: Please keep your 2 week and 6 week post-operative visits as previously scheduled. Contact information: [address] [place] [address] [phone]                 Signed: [name] 03/13/2023, 7:54 AM

## 2023-03-12 NOTE — Op Note (Addendum)
Pre Op Dx:   1. Chronic pelvic pain 2. History of uterine prolapse  Post Op Dx:   Same as pre-operative diagnoses  Procedure:   Robotic Assisted Total Laparoscopic Hysterectomy and Bilateral Salpingectomy    Surgeon:  Dr. [name] Assistants:  Karmen Stabs, RNFA Anesthesia:  General   EBL:  20cc  IVF:  500cc UOP:  200cc   Drains:  Foley catheter Specimen removed:  Uterus, cervix, and bilateral fallopian tubes - sent to pathology  Mirena IUD - discarded Device(s) implanted: None Case Type:  Clean-contaminated Findings:  Normal multiparous-appearing cervix and normal-appearing uterus. Significant uterine descensus. Inflamed-appearing left fallopian tube and normal-appearing right fallopian tube. Normal-appearing bilateral ovaries. Thick adhesion band from the anterior uterus to the anterior abdominal wall. Minimal, filmy adhesions between the bladder and lower uterine segment. Bilateral ureters visualized with peristalsis pre and post hysterectomy and after closure of vaginal cuff. Normal-appearing liver contours.  Complications: None Indications:  42 y.o. [id number] with chronic pelvic pain and history of uterine prolapse who desires definitive surgical management.  Description of each procedure:  After informed consent the patient was taken to the operating room and placed in dorsal supine position where general endotracheal anesthesia was administered and found to be adequate.  She was placed in dorsal lithotomy position with her arms tucked.  She was prepped and draped in the usual sterile fashion.  A timeout was called and the procedure confirmed.  A RUMI uterine manipulator with the Koh cup and a Foley catheter were placed.   A 8mm intraumbilical incision was made and a trochar was used to enter the abdomen under direct visualization.  Pneumoperitoneum was established and atraumatic entry confirmed. Two additional 8mm ports were placed on either side of the umbilicus under direct  visualization. All port sites were injected with 10cc local anesthetic. The pelvis was bathed in a 60cc Ropivicaine solution.  The patient was placed in Trendelenburg position and the [employer] robotic device was docked.  Next, attention was turned to the console where the hysterectomy was performed.  The right fallopian tube was divided at the mesosalpinx and the uteroovarian anastamosis was divided and the right round ligament was divided. This process was repeated on the contralateral side.  The anterior leaflet of the broad ligament was divided to create a bladder flap. The thick adhesion band from the anterior uterus to the anterior abdominal wall was taken down with electrosurgery. The uterine artery and vein were skeletonized and desiccated superior to the Koh cup.  This process was repeated on the contralateral side.  Uterine blanching was observed.  A circumferential colpotomy was created along the ridge of the Koh cup and the uterus was passed off the field.  The vaginal occluder was placed in the vagina to maintain pneumoperitoneum.  The vaginal cuff was then closed with V-loc suture. Hemostasis confirmed. Arista powder was placed on the vaginal cuff and adnexa bilaterally.  The [employer] Vinc[employer] robotic device was undocked and all ports were visualized.   The pneumoperitoneum was reduced completely with the assistance of two deep breaths and all ports were removed.  The skin was closed with 4-0 Vicryl in subcuticular fashion with skin glue placed atop each port site.   The vagina was inspected and there were no vaginal tears noted and no foreign objects remaining in the vagina. The vaginal cuff palpated to be intact. The patient was returned to dorsal supine position, awakened and extubated in the OR having appeared to tolerate the procedure well.  All sponge, needle, and instrument counts were correct x 2 at the end of the case.  Disposition:  PACU  [name], DO

## 2023-03-12 NOTE — Anesthesia Preprocedure Evaluation (Signed)
Anesthesia Evaluation  Patient identified by MRN, date of birth, ID band Patient awake    Reviewed: Allergy & Precautions, H&P , NPO status , Patient's Chart, lab work & pertinent test results  Airway Mallampati: II  TM Distance: >3 FB Neck ROM: Full    Dental no notable dental hx. (+) Dental Advisory Given   Pulmonary asthma , neg sleep apnea, neg COPD, former smoker   Pulmonary exam normal breath sounds clear to auscultation       Cardiovascular (-) hypertension(-) angina (-) Past MI negative cardio ROS Normal cardiovascular exam Rhythm:Regular Rate:Normal     Neuro/Psych  Headaches PSYCHIATRIC DISORDERS Anxiety        GI/Hepatic Neg liver ROS,GERD  ,,  Endo/Other  negative endocrine ROSneg diabetes    Renal/GU negative Renal ROS  Female GU complaint     Musculoskeletal negative musculoskeletal ROS (+)    Abdominal   Peds negative pediatric ROS (+)  Hematology negative hematology ROS (+)   Anesthesia Other Findings   Reproductive/Obstetrics negative OB ROS                             Anesthesia Physical Anesthesia Plan  ASA: 3  Anesthesia Plan: General   Post-op Pain Management: Tylenol PO (pre-op)*   Induction: Intravenous  PONV Risk Score and Plan: Ondansetron, Dexamethasone and Midazolam  Airway Management Planned: Oral ETT  Additional Equipment:   Intra-op Plan:   Post-operative Plan: Extubation in OR  Informed Consent: I have reviewed the patients History and Physical, chart, labs and discussed the procedure including the risks, benefits and alternatives for the proposed anesthesia with the patient or authorized representative who has indicated his/her understanding and acceptance.     Dental advisory given  Plan Discussed with: CRNA  Anesthesia Plan Comments:        Anesthesia Quick Evaluation

## 2023-03-12 NOTE — Anesthesia Procedure Notes (Signed)
Procedure Name: Intubation Date/Time: 03/12/2023 12:43 PM  Performed by: Shary Decamp, CRNAPre-anesthesia Checklist: Patient identified, Patient being monitored, Timeout performed, Emergency Drugs available and Suction available Patient Re-evaluated:Patient Re-evaluated prior to induction Oxygen Delivery Method: Circle System Utilized Preoxygenation: Pre-oxygenation with 100% oxygen Induction Type: IV induction Ventilation: Mask ventilation without difficulty Laryngoscope Size: Miller and 2 Grade View: Grade I Tube type: Oral Tube size: 7.0 mm Number of attempts: 1 Airway Equipment and Method: Stylet Placement Confirmation: ETT inserted through vocal cords under direct vision, positive ETCO2 and breath sounds checked- equal and bilateral Secured at: 21 cm Tube secured with: Tape Dental Injury: Teeth and Oropharynx as per pre-operative assessment

## 2023-03-13 ENCOUNTER — Encounter (HOSPITAL_COMMUNITY): Payer: Self-pay | Admitting: Obstetrics and Gynecology

## 2023-03-13 DIAGNOSIS — K219 Gastro-esophageal reflux disease without esophagitis: Secondary | ICD-10-CM | POA: Diagnosis not present

## 2023-03-13 DIAGNOSIS — R32 Unspecified urinary incontinence: Secondary | ICD-10-CM | POA: Diagnosis not present

## 2023-03-13 DIAGNOSIS — N814 Uterovaginal prolapse, unspecified: Secondary | ICD-10-CM | POA: Diagnosis not present

## 2023-03-13 DIAGNOSIS — R519 Headache, unspecified: Secondary | ICD-10-CM | POA: Diagnosis not present

## 2023-03-13 DIAGNOSIS — R102 Pelvic and perineal pain: Secondary | ICD-10-CM | POA: Diagnosis not present

## 2023-03-13 DIAGNOSIS — Z87891 Personal history of nicotine dependence: Secondary | ICD-10-CM | POA: Diagnosis not present

## 2023-03-13 DIAGNOSIS — G8929 Other chronic pain: Secondary | ICD-10-CM | POA: Diagnosis not present

## 2023-03-13 DIAGNOSIS — N736 Female pelvic peritoneal adhesions (postinfective): Secondary | ICD-10-CM | POA: Diagnosis not present

## 2023-03-13 DIAGNOSIS — Z87442 Personal history of urinary calculi: Secondary | ICD-10-CM | POA: Diagnosis not present

## 2023-03-13 DIAGNOSIS — J45909 Unspecified asthma, uncomplicated: Secondary | ICD-10-CM | POA: Diagnosis not present

## 2023-03-14 LAB — SURGICAL PATHOLOGY

## 2023-03-17 MED ORDER — FLUOXETINE HCL 10 MG PO CAPS: ORAL_CAPSULE | ORAL | 0 refills | Status: DC

## 2023-03-17 NOTE — Addendum Note (Signed)
Addended byTrena Platt on: 03/17/2023 08:03 AM   Modules accepted: Orders

## 2023-04-04 ENCOUNTER — Other Ambulatory Visit: Payer: Self-pay | Admitting: Internal Medicine

## 2023-04-04 DIAGNOSIS — F411 Generalized anxiety disorder: Secondary | ICD-10-CM

## 2023-06-03 ENCOUNTER — Other Ambulatory Visit (INDEPENDENT_AMBULATORY_CARE_PROVIDER_SITE_OTHER): Payer: Self-pay

## 2023-06-03 ENCOUNTER — Ambulatory Visit (INDEPENDENT_AMBULATORY_CARE_PROVIDER_SITE_OTHER): Admitting: Orthopedic Surgery

## 2023-06-03 VITALS — BP 135/83 | HR 81 | Ht 63.0 in | Wt 200.0 lb

## 2023-06-03 DIAGNOSIS — M25511 Pain in right shoulder: Secondary | ICD-10-CM

## 2023-06-03 DIAGNOSIS — M7501 Adhesive capsulitis of right shoulder: Secondary | ICD-10-CM

## 2023-06-03 NOTE — Progress Notes (Unsigned)
 New Patient Visit  Assessment: Tiffany Clark is a 43 y.o. female with the following: 1. Adhesive capsulitis of right shoulder ***   Plan: Caryl Never    Procedure note injection - Right shoulder    Verbal consent was obtained to inject the right shoulder, subacromial space Timeout was completed to confirm the site of injection.   The skin was prepped with alcohol and ethyl chloride was sprayed at the injection site.  A 21-gauge needle was used to inject 40 mg of Depo-Medrol and 1% lidocaine (4 cc) into the subacromial space of the right shoulder using a posterolateral approach.  There were no complications.  A sterile bandage was applied.    Follow-up: No follow-ups on file.  Subjective:  Chief Complaint  Patient presents with   Shoulder Pain    R for 2-3 mos getting worse. No injury that she can think of may have overused it decorating.     History of Present Illness: Tiffany Clark is a 43 y.o. female who {Presentation:27320} for evaluation of    Review of Systems: No fevers or chills*** No numbness or tingling No chest pain No shortness of breath No bowel or bladder dysfunction No GI distress No headaches   Medical History:  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 and of unspecified site    Intrauterine normal pregnancy 04/29/2014   Missed ab    x 3 - no surgery required    Other abnormalities in shape or position of gravid uterus and of neighboring structures, antepartum    prolapse during pregnancy   SVD (spontaneous vaginal delivery)    x 3   Threatened abortion in first trimester 07/24/2013   Urinary  retention 10/03/2011    Past Surgical History:  Procedure Laterality Date   CARPAL TUNNEL RELEASE     Right  WRIST ONLY   CESAREAN SECTION N/A 04/29/2014   Procedure: CESAREAN SECTION;  Surgeon: Sharon Seller, DO;  Location: [facility];  Service: Obstetrics;  Laterality: N/A;   CESAREAN SECTION N/A    Phreesia 01/10/2020   LITHOTRIPSY     x 1   ROBOTIC ASSISTED TOTAL HYSTERECTOMY WITH BILATERAL SALPINGO OOPHERECTOMY Bilateral 03/12/2023   Procedure: XI ROBOTIC ASSISTED TOTAL HYSTERECTOMY WITH BILATERAL SALPINGO;  Surgeon: [name], DO;  Location: [facility];  Service: Gynecology;  Laterality: Bilateral;   WISDOM TOOTH EXTRACTION      Family History  Adopted: Yes  Problem Relation Age of Onset   Other Neg Hx    Social History   Tobacco Use   Smoking status: Former    Current packs/day: 0.00    Average packs/day: 0.8 packs/day for 6.0 years (4.5 ttl pk-yrs)    Types: Cigarettes    Start date: 10/31/1998    Quit date: 10/30/2004    Years since quitting: 18.6   Smokeless tobacco: Never  Vaping Use   Vaping status: Never Used  Substance Use Topics   Alcohol use: No   Drug use: No    Allergies  Allergen Reactions   Aleve [  Naproxen Sodium]     Heart races and sweaty palms. Feels like panic attack. Can take Ibuprofen   Hydrocodone Nausea And Vomiting   Naproxen     Other reaction(s): Unknown   Latex Rash    Other reaction(s): Unknown    No outpatient medications have been marked as taking for the 06/03/23 encounter (Office Visit) with Oliver Barre, MD.    Objective: BP 135/83   Pulse 81   Ht 5\' 3"  (1.6 m)   Wt 200 lb (90.7 kg)   LMP 06/03/2013   BMI 35.43 kg/m   Physical Exam:  General: {General PE Findings:25791} Gait: {Gait:25792}    IMAGING: {XR Reviewed:24899}   New Medications:  No orders of the defined types were placed in this encounter.     Oliver Barre, MD  06/03/2023 2:00 PM

## 2023-06-03 NOTE — Patient Instructions (Signed)

## 2023-06-04 ENCOUNTER — Encounter: Payer: Self-pay | Admitting: Orthopedic Surgery

## 2023-07-02 DIAGNOSIS — Z90710 Acquired absence of both cervix and uterus: Secondary | ICD-10-CM | POA: Diagnosis not present

## 2023-07-02 DIAGNOSIS — N939 Abnormal uterine and vaginal bleeding, unspecified: Secondary | ICD-10-CM | POA: Diagnosis not present

## 2023-07-02 DIAGNOSIS — R1032 Left lower quadrant pain: Secondary | ICD-10-CM | POA: Diagnosis not present

## 2023-07-02 DIAGNOSIS — L929 Granulomatous disorder of the skin and subcutaneous tissue, unspecified: Secondary | ICD-10-CM | POA: Diagnosis not present

## 2023-07-02 DIAGNOSIS — R102 Pelvic and perineal pain: Secondary | ICD-10-CM | POA: Diagnosis not present

## 2023-07-03 ENCOUNTER — Other Ambulatory Visit (HOSPITAL_COMMUNITY): Payer: Self-pay | Admitting: Family Medicine

## 2023-07-03 ENCOUNTER — Encounter (HOSPITAL_COMMUNITY): Payer: Self-pay | Admitting: Obstetrics and Gynecology

## 2023-07-03 DIAGNOSIS — R1032 Left lower quadrant pain: Secondary | ICD-10-CM

## 2023-07-03 DIAGNOSIS — R102 Pelvic and perineal pain: Secondary | ICD-10-CM | POA: Diagnosis not present

## 2023-07-04 ENCOUNTER — Ambulatory Visit (INDEPENDENT_AMBULATORY_CARE_PROVIDER_SITE_OTHER): Admitting: Orthopedic Surgery

## 2023-07-04 DIAGNOSIS — M7501 Adhesive capsulitis of right shoulder: Secondary | ICD-10-CM

## 2023-07-04 NOTE — Patient Instructions (Signed)

## 2023-07-04 NOTE — Progress Notes (Signed)
 Return patient Visit  Assessment: Tiffany Clark is a 43 y.o. female with the following: 1. Adhesive capsulitis of right shoulder   Plan: Tiffany Clark continues to have a lot of pain in the right shoulder.  Very restricted range of motion.  Subacromial steroid injection did not improve her symptoms.  Presentation is still consistent with adhesive capsulitis.  I have recommended a high-volume steroid injection.  This was completed in clinic today using an ultrasound.  I would like her to update me within a couple weeks, to see if she is having any improvements.  If she is not having any improvements, would recommend referral to Dr. Shon Baton for a series of high-volume steroid injections.  She states understanding.  Follow-up will be as needed, but anticipate hear from her within a couple weeks.  Procedure note injection - Right shoulder, ultrasound guidance   Verbal consent was obtained to inject the Right shoulder, glenohumeral joint  Timeout was completed to confirm the site of injection.   Using the ultrasound, the rotator cuff tendons were identified.  The joint space was also identified. The skin was prepped with alcohol and ethyl chloride was sprayed at the injection site.  A 21-gauge needle was used to inject 40 mg of Depo-Medrol and 1% lidocaine (4 cc) and 5 cc of normal saline into the glenohumeral joint space of the Right shoulder using a posterolateral approach.  The needle was visualized entering the glenohumeral joint, and the medication was also visualized. There were no complications.  A sterile bandage was applied.   Note: In order to accurately identify the placement of the needle, ultrasound was required, to increase the accuracy, and specificity of the injection.   Follow-up: Return if symptoms worsen or fail to improve.  Subjective:  Chief Complaint  Patient presents with   Shoulder Pain    Rt felt no better after injection and now states she has an electrical  surging pain down the arm.     History of Present Illness: Tiffany Clark is a 43 y.o. female who returns for repeat evaluation of right shoulder pain.  I saw her in clinic about a month ago.  She was diagnosed with adhesive capsulitis.  We completed a subacromial steroid injection.  She states that she had limited improvement in the symptoms.  Within a few days after the injection, her pain started to get worse.  It started radiating down the arm.  She continues to have restricted motion.   Review of Systems: No fevers or chills No numbness or tingling No chest pain No shortness of breath No bowel or bladder dysfunction No GI distress No headaches  Objective: LMP 06/03/2013   Physical Exam:  General: Alert and oriented. and No acute distress. Gait: Normal gait.  Evaluation of the right shoulder demonstrates no deformity.  No redness.  No swelling.  Very limited active motion.  Forward flexion limited to 90 degrees.  Very little active external rotation at her side, and this is very painful.  45 degrees of external rotation of the left shoulder.  Fingers are warm and well-perfused.  Sensation is intact throughout the right hand.  IMAGING: I personally ordered and reviewed the following images    New Medications:  No orders of the defined types were placed in this encounter.     Oliver Barre, MD  07/04/2023 10:59 AM

## 2023-07-05 ENCOUNTER — Encounter: Payer: Self-pay | Admitting: Orthopedic Surgery

## 2023-07-06 ENCOUNTER — Ambulatory Visit (HOSPITAL_BASED_OUTPATIENT_CLINIC_OR_DEPARTMENT_OTHER)
Admission: RE | Admit: 2023-07-06 | Discharge: 2023-07-06 | Disposition: A | Discharge status: Home / Self Care | Source: Ambulatory Visit | Attending: Family Medicine | Admitting: Family Medicine

## 2023-07-06 DIAGNOSIS — R1032 Left lower quadrant pain: Secondary | ICD-10-CM | POA: Insufficient documentation

## 2023-07-06 DIAGNOSIS — N200 Calculus of kidney: Secondary | ICD-10-CM | POA: Diagnosis not present

## 2023-07-06 DIAGNOSIS — K429 Umbilical hernia without obstruction or gangrene: Secondary | ICD-10-CM | POA: Diagnosis not present

## 2023-07-06 MED ORDER — IOHEXOL 300 MG/ML  SOLN
100.0000 mL | Freq: Once | INTRAMUSCULAR | Status: AC | PRN
  Administered 2023-07-06: 100 mL via INTRAVENOUS

## 2023-07-11 ENCOUNTER — Telehealth: Payer: Self-pay

## 2023-07-11 ENCOUNTER — Inpatient Hospital Stay: Attending: Oncology | Admitting: Oncology

## 2023-07-11 ENCOUNTER — Inpatient Hospital Stay

## 2023-07-11 ENCOUNTER — Encounter: Payer: Self-pay | Admitting: Oncology

## 2023-07-11 VITALS — BP 124/76 | HR 84 | Temp 98.4°F | Resp 16 | Wt 199.1 lb

## 2023-07-11 DIAGNOSIS — I82890 Acute embolism and thrombosis of other specified veins: Secondary | ICD-10-CM | POA: Diagnosis present

## 2023-07-11 DIAGNOSIS — R5383 Other fatigue: Secondary | ICD-10-CM | POA: Insufficient documentation

## 2023-07-11 DIAGNOSIS — Z886 Allergy status to analgesic agent status: Secondary | ICD-10-CM | POA: Diagnosis not present

## 2023-07-11 DIAGNOSIS — N200 Calculus of kidney: Secondary | ICD-10-CM | POA: Insufficient documentation

## 2023-07-11 DIAGNOSIS — Z7901 Long term (current) use of anticoagulants: Secondary | ICD-10-CM | POA: Insufficient documentation

## 2023-07-11 DIAGNOSIS — R1032 Left lower quadrant pain: Secondary | ICD-10-CM | POA: Insufficient documentation

## 2023-07-11 DIAGNOSIS — Z79899 Other long term (current) drug therapy: Secondary | ICD-10-CM | POA: Insufficient documentation

## 2023-07-11 DIAGNOSIS — G8929 Other chronic pain: Secondary | ICD-10-CM | POA: Diagnosis not present

## 2023-07-11 DIAGNOSIS — I8290 Acute embolism and thrombosis of unspecified vein: Secondary | ICD-10-CM | POA: Diagnosis not present

## 2023-07-11 DIAGNOSIS — Z885 Allergy status to narcotic agent status: Secondary | ICD-10-CM | POA: Insufficient documentation

## 2023-07-11 LAB — COMPREHENSIVE METABOLIC PANEL WITH GFR
ALT: 17 U/L (ref 0–44)
AST: 16 U/L (ref 15–41)
Albumin: 3.8 g/dL (ref 3.5–5.0)
Alkaline Phosphatase: 68 U/L (ref 38–126)
Anion gap: 10 (ref 5–15)
BUN: 21 mg/dL — ABNORMAL HIGH (ref 6–20)
CO2: 23 mmol/L (ref 22–32)
Calcium: 9.5 mg/dL (ref 8.9–10.3)
Chloride: 103 mmol/L (ref 98–111)
Creatinine, Ser: 0.72 mg/dL (ref 0.44–1.00)
GFR, Estimated: >60 mL/min (ref >60)
Glucose, Bld: 114 mg/dL — ABNORMAL HIGH (ref 70–99)
Potassium: 3.9 mmol/L (ref 3.5–5.1)
Sodium: 136 mmol/L (ref 135–145)
Total Bilirubin: 0.5 mg/dL (ref 0.0–1.2)
Total Protein: 7.8 g/dL (ref 6.5–8.1)

## 2023-07-11 LAB — CBC WITH DIFFERENTIAL/PLATELET
Abs Immature Granulocytes: 0.04 10*3/uL (ref 0.00–0.07)
Basophils Absolute: 0 10*3/uL (ref 0.0–0.1)
Basophils Relative: 0 %
Eosinophils Absolute: 0.1 10*3/uL (ref 0.0–0.5)
Eosinophils Relative: 2 %
HCT: 40.9 % (ref 36.0–46.0)
Hemoglobin: 13.6 g/dL (ref 12.0–15.0)
Immature Granulocytes: 1 %
Lymphocytes Relative: 24 %
Lymphs Abs: 1.8 10*3/uL (ref 0.7–4.0)
MCH: 29.6 pg (ref 26.0–34.0)
MCHC: 33.3 g/dL (ref 30.0–36.0)
MCV: 89.1 fL (ref 80.0–100.0)
Monocytes Absolute: 0.5 10*3/uL (ref 0.1–1.0)
Monocytes Relative: 7 %
Neutro Abs: 5 10*3/uL (ref 1.7–7.7)
Neutrophils Relative %: 66 %
Platelets: 391 10*3/uL (ref 150–400)
RBC: 4.59 MIL/uL (ref 3.87–5.11)
RDW: 12.5 % (ref 11.5–15.5)
WBC: 7.5 10*3/uL (ref 4.0–10.5)
nRBC: 0 % (ref 0.0–0.2)

## 2023-07-11 LAB — ANTITHROMBIN III: AntiThromb III Func: 112 % (ref 75–120)

## 2023-07-11 LAB — IRON AND TIBC
Iron: 64 ug/dL (ref 28–170)
Saturation Ratios: 20 % (ref 10.4–31.8)
TIBC: 327 ug/dL (ref 250–450)
UIBC: 263 ug/dL

## 2023-07-11 LAB — APTT: aPTT: 25 s (ref 24–36)

## 2023-07-11 LAB — VITAMIN B12: Vitamin B-12: 393 pg/mL (ref 180–914)

## 2023-07-11 LAB — FOLATE: Folate: 13.6 ng/mL (ref >5.9)

## 2023-07-11 LAB — FERRITIN: Ferritin: 69 ng/mL (ref 11–307)

## 2023-07-11 MED ORDER — APIXABAN 5 MG PO TABS: ORAL_TABLET | ORAL | 2 refills | Status: DC

## 2023-07-11 NOTE — Patient Instructions (Signed)
 VISIT SUMMARY:  You were seen today due to blood clots found in the veins of both ovaries, new onset abdominal pain, joint pain in your knees and shoulders, and a general feeling of fatigue. We discussed your recent hysterectomy and history of multiple miscarriages. You have not yet started on your prescribed Lovenox injections due to pharmacy issues.  YOUR PLAN:  -GONADAL VEIN THROMBOSIS: Gonadal vein thrombosis is a blood clot in the veins of the ovaries, which can occur after surgery like a hysterectomy. Given your history of miscarriages, there may be a hereditary factor. We will start you on Eliquis, an anticoagulant, at 10 mg twice daily for 7 days, then 5 mg twice daily. Please be cautious of bleeding risks, especially with falls or cuts. We will review your blood work in two weeks to adjust treatment if necessary.  -FATIGUE: Persistent fatigue can be a sign of anemia, especially after surgery. We will conduct iron studies and an anemia workup to determine if this is the cause.  -JOINT PAIN: Your joint pain in the knees and shoulders suggests involvement of large joints. The absence of small joint pain or rash helps rule out certain autoimmune conditions. We will continue to monitor this.  -GENERAL HEALTH MAINTENANCE: Your mammogram is up to date, with the next one scheduled for May. Your Pap smear records are maintained elsewhere.  INSTRUCTIONS:  Please start taking Eliquis as prescribed: 10 mg twice daily for 7 days, then 5 mg twice daily. Be cautious of bleeding risks. We will review your blood work in two weeks to adjust your treatment if necessary. Additionally, we will conduct iron studies and an anemia workup to investigate the cause of your fatigue.

## 2023-07-11 NOTE — Assessment & Plan Note (Signed)
 Patient reported some fatigue today.  - Obtain blood work including CBC, CMP, iron panel, ferritin, vitamin B12 and folate.  Will replace as needed.

## 2023-07-11 NOTE — Progress Notes (Signed)
 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 -Patient has a history of 3 first trimester miscarriages.   -Had no second trimester miscarriages.   -Unknown family history as patient is adopted. Cancer screening: Mammogram, Pap smear up-to-date. Current treatment: Eliquis 5mg  BID  HISTORY OF PRESENT ILLNESS:  Tiffany Clark 43 y.o. female referred for bilateral gonadal vein thrombosis.  Patient has no significant past medical history.  Patient had a recent hysterectomy done in December 2024 for chronic pelvic pain.  Since then, has been doing well.  She reported to her OB/GYN last week with abdominal pain.  She had a CT scan done at that time showing bilateral gonadal vein thrombosis.  Patient did not start her Lovenox shots that her GYN prescribed for her yet.  She has no other complaints today.  She reports that the pain is still there but is not as severe as it was before.  She is able to tolerated with some help from tramadol.  She denies nausea, vomiting, loss of weight, loss of appetite.  She also reports general feeling of fatigue and slowing down over the past couple of years.  She denies any rash, reports some joint pain in the knees and shoulders.  She denies any other complaints today.  Patient denies any previous clots.  She reported that she had 2 first trimester miscarriages in 2006 that were very early 6 to 10 weeks.  She also had another 1 in 2015 and that 2 was early.  She did not have any second trimester miscarriages.  She is a non-smoker, does not drink alcohol.  Does not know family history as patient was adopted.  She works as a Designer, industrial/product at [employer].  I have reviewed the past medical history, past surgical history, social  history and family history with the patient   ALLERGIES:  is allergic to aleve [naproxen sodium], hydrocodone, naproxen, and latex.  MEDICATIONS:  Current Outpatient Medications  Medication Sig Dispense Refill   apixaban (ELIQUIS) 5 MG TABS tablet Take 2 tablets (10mg ) twice daily for 7 days, then 1 tablet (5mg ) twice daily 60 tablet 2   Cholecalciferol (VITAMIN D3) 50 MCG (2000 UT) capsule Take 2,000 Units by mouth daily.     ibuprofen (ADVIL) 800 MG tablet Take 1 tablet (800 mg total) by mouth every 8 (eight) hours as needed. 30 tablet 0   traMADol (ULTRAM) 50 MG tablet Take 1 tablet (50 mg total) by mouth every 6 (six) hours as needed for severe pain (pain score 7-10). 20 tablet 0   No current facility-administered medications for this visit.     REVIEW OF SYSTEMS:   Constitutional: Denies fevers, chills or night sweats Eyes: Denies blurriness of vision Ears, nose, mouth, throat, and face: Denies mucositis or sore throat Respiratory: Denies cough, dyspnea or wheezes Cardiovascular: Denies palpitation, chest discomfort or lower extremity swelling Gastrointestinal:  Denies nausea, heartburn or change in bowel habits Skin: Denies abnormal skin rashes Lymphatics: Denies new lymphadenopathy or easy bruising Neurological:Denies numbness, tingling or new weaknesses Behavioral/Psych: Mood is stable, no new changes  All other systems were reviewed with the patient and are negative.  PHYSICAL EXAMINATION:   Vitals:   07/11/23 0758  BP: 124/76  Pulse: 84  Resp: 16  Temp: 98.4 F (36.9  C)  SpO2: 96%    GENERAL:alert, no distress and comfortable LUNGS: clear to auscultation and percussion with normal breathing effort HEART: regular rate & rhythm and no murmurs and no lower extremity edema ABDOMEN:abdomen soft, non-tender and normal bowel sounds Musculoskeletal:no cyanosis of digits and no clubbing  NEURO: alert & oriented x 3 with fluent speech  LABORATORY DATA:  I have  reviewed the data as listed  Lab Results  Component Value Date   WBC 7.5 07/11/2023   NEUTROABS 5.0 07/11/2023   HGB 13.6 07/11/2023   HCT 40.9 07/11/2023   MCV 89.1 07/11/2023   PLT 391 07/11/2023       Chemistry      Component Value Date/Time   NA 136 07/11/2023 0837   NA 138 08/02/2022 1001   K 3.9 07/11/2023 0837   CL 103 07/11/2023 0837   CO2 23 07/11/2023 0837   BUN 21 (H) 07/11/2023 0837   BUN 11 08/02/2022 1001   CREATININE 0.72 07/11/2023 0837      Component Value Date/Time   CALCIUM 9.5 07/11/2023 0837   ALKPHOS 68 07/11/2023 0837   AST 16 07/11/2023 0837   ALT 17 07/11/2023 0837   BILITOT 0.5 07/11/2023 0837   BILITOT 0.5 08/02/2022 1001       RADIOGRAPHIC STUDIES: I have personally reviewed the radiological images as listed and agreed with the findings in the report.  CT ABDOMEN PELVIS W CONTRAST CLINICAL DATA:  Left lower quadrant abdominal pain.  EXAM: CT ABDOMEN AND PELVIS WITH CONTRAST  TECHNIQUE: Multidetector CT imaging of the abdomen and pelvis was performed using the standard protocol following bolus administration of intravenous contrast.  RADIATION DOSE REDUCTION: This exam was performed according to the departmental dose-optimization program which includes automated exposure control, adjustment of the mA and/or kV according to patient size and/or use of iterative reconstruction technique.  CONTRAST:  OMNIPAQUE IOHEXOL 300 MG/ML  SOLN  COMPARISON:  CT dated 06/03/2022.  FINDINGS: Lower chest: The visualized lung bases are clear.  No intra-abdominal free air or free fluid.  Hepatobiliary: The liver is unremarkable. No biliary dilatation. The gallbladder is unremarkable  Pancreas: Unremarkable. No pancreatic ductal dilatation or surrounding inflammatory changes.  Spleen: Normal in size without focal abnormality.  Adrenals/Urinary Tract: The adrenal glands are unremarkable. A 9 mm nonobstructing right renal inferior  pole and a punctate left renal lower pole calculus. There is no hydronephrosis on either side. The visualized ureters appear unremarkable the urinary bladder is collapsed.  Stomach/Bowel: There is no bowel obstruction or active inflammation. The appendix is normal. There is focal area of haziness the fat adjacent to the sigmoid colon in the left lower quadrant (60/2 and coronal 72/4) which may represent an inflammatory process such as an epiploic appendagitis or a developing omental infarct.  Vascular/Lymphatic: The abdominal aorta and IVC are unremarkable. Bilateral gonadal vein thrombi, right greater than left. No portal venous gas. There is no adenopathy.  Reproductive: Hysterectomy.  No suspicious adnexal masses.  Other: Small fat containing umbilical hernia.  Musculoskeletal: No acute or significant osseous findings.  IMPRESSION: 1. Focal area of haziness the fat adjacent to the sigmoid colon may represent an epiploic appendagitis or a developing omental infarct. Bilateral gonadal vein thrombi, right greater than left. 2. Nonobstructing bilateral renal calculi measure up to 9 mm on the right. No hydronephrosis.  These results will be called to the ordering clinician or representative by the Radiologist Assistant, and communication documented in the PACS or [employer].  Electronically Signed   By: Elgie Collard M.D.   On: 07/06/2023 21:47   ASSESSMENT & PLAN:  Patient is a 43 y.o. 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 Risk factors:  -Recent hysterectomy in 05/12/2022. -Patient has a history of 3 first trimester miscarriages.   -Had no second trimester miscarriages.   -Unknown family history as patient is adopted. Cancer screening: Mammogram, Pap smear up-to-date. Current treatment:  None  -Will do thrombophilia workup including anticardiolipin antibodies, antibeta 2 glycoprotein antibodies, factor V Leyden and prothrombin gene mutation testing.  Will also check protein C, protein S and lupus anticoagulant testing  -Start the patient on Eliquis starter pack with 10 mg twice daily for 7 days followed by 5 mg twice daily -As this is likely provoked clot patient can do anticoagulation for 3 months unless any of the above testing comes back with a positive result.  We can also do a D-dimer based approach at the end of 3 months.  Return to clinic in 1 months to discuss results and further management   Fatigue Patient reported some fatigue today.  - Obtain blood work including CBC, CMP, iron panel, ferritin, vitamin B12 and folate.  Will replace as needed.   Orders Placed This Encounter  Procedures   CBC with Differential/Platelet    Standing Status:   Future    Number of Occurrences:   1    Expected Date:   07/11/2023    Expiration Date:   07/10/2024   Comprehensive metabolic panel with GFR    Standing Status:   Future    Number of Occurrences:   1    Expected Date:   07/11/2023    Expiration Date:   07/10/2024   Ferritin    Standing Status:   Future    Number of Occurrences:   1    Expected Date:   07/11/2023    Expiration Date:   07/10/2024   Folate    Standing Status:   Future    Number of Occurrences:   1    Expected Date:   07/11/2023    Expiration Date:   07/10/2024   Vitamin B12    Standing Status:   Future    Number of Occurrences:   1    Expected Date:   07/11/2023    Expiration Date:   07/10/2024   Iron and TIBC    Standing Status:   Future    Number of Occurrences:   1    Expected Date:   07/11/2023    Expiration Date:   07/10/2024   Beta-2-glycoprotein i abs, IgG/M/A    Standing Status:   Future    Number of Occurrences:   1    Expected Date:   07/11/2023    Expiration Date:   07/10/2024   Cardiolipin antibodies, IgG, IgM, IgA    Standing Status:    Future    Number of Occurrences:   1    Expected Date:   07/11/2023    Expiration Date:   07/10/2024   Antithrombin III    Standing Status:   Future    Number of Occurrences:   1    Expected Date:   07/11/2023    Expiration Date:   07/10/2024   Prothrombin gene mutation    Standing Status:   Future    Number of  Occurrences:   1    Expected Date:   07/11/2023    Expiration Date:   07/10/2024   Protein C activity    Standing Status:   Future    Number of Occurrences:   1    Expected Date:   07/11/2023    Expiration Date:   07/10/2024   Protein C, total    Standing Status:   Future    Number of Occurrences:   1    Expected Date:   07/11/2023    Expiration Date:   07/10/2024   Protein S activity    Standing Status:   Future    Number of Occurrences:   1    Expected Date:   07/11/2023    Expiration Date:   07/10/2024   Protein S, total    Standing Status:   Future    Number of Occurrences:   1    Expected Date:   07/11/2023    Expiration Date:   07/10/2024   Lupus anticoagulant panel    Standing Status:   Future    Number of Occurrences:   1    Expected Date:   07/11/2023    Expiration Date:   07/10/2024   APTT    Standing Status:   Future    Number of Occurrences:   1    Expected Date:   07/11/2023    Expiration Date:   07/10/2024   Factor 5 leiden    Standing Status:   Future    Number of Occurrences:   1    Expected Date:   07/11/2023    Expiration Date:   07/10/2024    The total time spent in the appointment was 60 minutes encounter with patients including review of chart and various tests results, discussions about plan of care and coordination of care plan   All questions were answered. The patient knows to call the clinic with any problems, questions or concerns. No barriers to learning was detected.   Cindie Crumbly, MD 4/11/20251:29 PM

## 2023-07-11 NOTE — Assessment & Plan Note (Signed)
 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 Risk factors:  -Recent hysterectomy in 05/12/2022. -Patient has a history of 3 first trimester miscarriages.   -Had no second trimester miscarriages.   -Unknown family history as patient is adopted. Cancer screening: Mammogram, Pap smear up-to-date. Current treatment: None  -Will do thrombophilia workup including anticardiolipin antibodies, antibeta 2 glycoprotein antibodies, factor V Leyden and prothrombin gene mutation testing.  Will also check protein C, protein S and lupus anticoagulant testing  -Start the patient on Eliquis starter pack with 10 mg twice daily for 7 days followed by 5 mg twice daily -As this is likely provoked clot patient can do anticoagulation for 3 months unless any of the above testing comes back with a positive result.  We can also do a D-dimer based approach at the end of 3 months.  Return to clinic in 1 months to discuss results and further management

## 2023-07-11 NOTE — Telephone Encounter (Signed)
 Notified Patient of denial of prior authorization request for Eliquis. Response received as follows: [employer] cannot perform this prior authorization request because prior authorization is not required for the requested drug and medication quantities above the benefit limit are excluded under the plan. For additional information on plan benefits, the member can contact Member Services by calling the number on the back of their ID card. Advised Patient to contact Member Services regarding plan benefits. No other needs or concerns noted at this time.

## 2023-07-12 LAB — PROTEIN C ACTIVITY: Protein C Activity: 115 % (ref 73–180)

## 2023-07-12 LAB — PROTEIN S, TOTAL: Protein S Ag, Total: 107 % (ref 60–150)

## 2023-07-12 LAB — LUPUS ANTICOAGULANT PANEL
DRVVT: 49.5 s — ABNORMAL HIGH (ref 0.0–47.0)
PTT Lupus Anticoagulant: 30.3 s (ref 0.0–43.5)

## 2023-07-12 LAB — DRVVT CONFIRM: dRVVT Confirm: 1.2 ratio (ref 0.8–1.2)

## 2023-07-12 LAB — DRVVT MIX: dRVVT Mix: 46 s — ABNORMAL HIGH (ref 0.0–40.4)

## 2023-07-12 LAB — PROTEIN S ACTIVITY: Protein S Activity: 98 % (ref 63–140)

## 2023-07-13 LAB — BETA-2-GLYCOPROTEIN I ABS, IGG/M/A
Beta-2 Glyco I IgG: <9 GPI IgG units (ref 0–20)
Beta-2-Glycoprotein I IgA: <9 GPI IgA units (ref 0–25)
Beta-2-Glycoprotein I IgM: <9 GPI IgM units (ref 0–32)

## 2023-07-15 LAB — FACTOR 5 LEIDEN

## 2023-07-15 LAB — PROTEIN C, TOTAL: Protein C, Total: 97 % (ref 60–150)

## 2023-07-16 LAB — CARDIOLIPIN ANTIBODIES, IGG, IGM, IGA
Anticardiolipin IgA: <9 U/mL (ref 0–11)
Anticardiolipin IgG: <9 GPL U/mL (ref 0–14)
Anticardiolipin IgM: <9 [MPL'U]/mL (ref 0–12)

## 2023-07-17 LAB — PROTHROMBIN GENE MUTATION

## 2023-07-23 ENCOUNTER — Encounter: Payer: Self-pay | Admitting: Orthopedic Surgery

## 2023-07-23 ENCOUNTER — Other Ambulatory Visit: Payer: Self-pay

## 2023-07-23 DIAGNOSIS — M7501 Adhesive capsulitis of right shoulder: Secondary | ICD-10-CM

## 2023-07-25 ENCOUNTER — Other Ambulatory Visit (HOSPITAL_COMMUNITY): Payer: Self-pay | Admitting: Obstetrics and Gynecology

## 2023-07-25 DIAGNOSIS — Z1231 Encounter for screening mammogram for malignant neoplasm of breast: Secondary | ICD-10-CM

## 2023-07-28 ENCOUNTER — Ambulatory Visit: Admitting: Oncology

## 2023-08-01 ENCOUNTER — Inpatient Hospital Stay: Attending: Oncology | Admitting: Oncology

## 2023-08-01 VITALS — BP 121/78 | HR 72 | Temp 98.2°F | Resp 16 | Wt 197.4 lb

## 2023-08-01 DIAGNOSIS — Z886 Allergy status to analgesic agent status: Secondary | ICD-10-CM | POA: Insufficient documentation

## 2023-08-01 DIAGNOSIS — Z79899 Other long term (current) drug therapy: Secondary | ICD-10-CM | POA: Diagnosis not present

## 2023-08-01 DIAGNOSIS — E559 Vitamin D deficiency, unspecified: Secondary | ICD-10-CM | POA: Insufficient documentation

## 2023-08-01 DIAGNOSIS — I8290 Acute embolism and thrombosis of unspecified vein: Secondary | ICD-10-CM

## 2023-08-01 DIAGNOSIS — E538 Deficiency of other specified B group vitamins: Secondary | ICD-10-CM | POA: Insufficient documentation

## 2023-08-01 DIAGNOSIS — Z885 Allergy status to narcotic agent status: Secondary | ICD-10-CM | POA: Diagnosis not present

## 2023-08-01 DIAGNOSIS — Z7901 Long term (current) use of anticoagulants: Secondary | ICD-10-CM | POA: Diagnosis not present

## 2023-08-01 DIAGNOSIS — R5383 Other fatigue: Secondary | ICD-10-CM | POA: Insufficient documentation

## 2023-08-01 DIAGNOSIS — I82890 Acute embolism and thrombosis of other specified veins: Secondary | ICD-10-CM | POA: Diagnosis not present

## 2023-08-01 NOTE — Patient Instructions (Signed)
 VISIT SUMMARY:  KAYLEAN RENFROW, a 43 year old female, visited for a follow-up on her blood thinner therapy post-surgery. She is currently taking Eliquis  without any issues and has managed to reduce her medication cost using an online coupon. She experiences chronic fatigue, which may be related to slightly low vitamin B12 and previously low vitamin D  levels. Her iron levels are normal. There are no signs of abdominal pain, blood in stools, or bleeding episodes. Her family history includes her birth mother having two strokes last year.  YOUR PLAN:  -BLOOD CLOT IN OVARIAN VEIN: A blood clot in the ovarian vein can occur after surgery and is considered transient if tests for clotting disorders are negative. You should continue taking Eliquis  for three months and then discontinue it without needing a D-dimer test. We will discuss this further during your follow-up in June.  -VITAMIN B12 DEFICIENCY: Low levels of vitamin B12 can contribute to fatigue. You should start taking an over-the-counter vitamin B12 supplement daily.  -VITAMIN D  DEFICIENCY: Low levels of vitamin D  can also contribute to fatigue. Continue taking your vitamin D  supplement, with a dosage between 1000 to 5000 IU daily.  INSTRUCTIONS:  Please schedule a follow-up appointment in June to discuss the discontinuation of Eliquis .

## 2023-08-01 NOTE — Assessment & Plan Note (Signed)
 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 Risk factors:  -Recent hysterectomy in 05/12/2022. -Patient has a history of 3 first trimester miscarriages.   -Had no second trimester miscarriages.   -Unknown family history as patient is adopted. Cancer screening: Mammogram, Pap smear up-to-date. Workup:  Anticardiolipin antibody, antibeta 2 glycoprotein antibody, PT gene mutation, factor V Leiden: Negative Protein C, protein S: Normal Current treatment: Eliquis  5mg  BID  - Continue Eliquis  for a total duration of 3 months. - Discussed discontinuing anticoagulation at 57-month mark considering this is a provoked clot versus doing a D-dimer approach.  Patient preferred to discontinue without checking for D-dimer.  Return to clinic in June for follow-up and safe discontinuation of anticoagulation

## 2023-08-01 NOTE — Assessment & Plan Note (Signed)
 Patient has a vitamin B12 level of less than 400 reports fatigue  -Start taking vitamin B12 1000 mcg daily orally

## 2023-08-01 NOTE — Progress Notes (Signed)
 [facility] at [facility]  HEMATOLOGY FOLLOW-UP VISIT  Meldon Sport, MD  REASON FOR FOLLOW-UP: Venous thromboembolism  ASSESSMENT & PLAN:  Patient is a 43 y.o. 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 Risk factors:  -Recent hysterectomy in 05/12/2022. -Patient has a history of 3 first trimester miscarriages.   -Had no second trimester miscarriages.   -Unknown family history as patient is adopted. Cancer screening: Mammogram, Pap smear up-to-date. Workup:  Anticardiolipin antibody, antibeta 2 glycoprotein antibody, PT gene mutation, factor V Leiden: Negative Protein C, protein S: Normal Current treatment: Eliquis  5mg  BID  - Continue Eliquis  for a total duration of 3 months. - Discussed discontinuing anticoagulation at 8-month mark considering this is a provoked clot versus doing a D-dimer approach.  Patient preferred to discontinue without checking for D-dimer.  Return to clinic in June for follow-up and safe discontinuation of anticoagulation   Vitamin B12 deficiency Patient has a vitamin B12 level of less than 400 reports fatigue  -Start taking vitamin B12 1000 mcg daily orally   No orders of the defined types were placed in this encounter.   The total time spent in the appointment was 20 minutes encounter with patients including review of chart and various tests results, discussions about plan of care and coordination of care plan  All questions were answered. The patient knows to call the clinic with any problems, questions or concerns. No barriers to learning was detected.  Eduardo Grade, MD 5/2/20258:56 AM   SUMMARY OF HEMATOLOGIC HISTORY: Date: 07/06/2023 1st episode: Bilateral gonadal vein thrombosis Risk factors: Recent hysterectomy in 05/12/2022. -Patient has a history of 3 first  trimester miscarriages.   -Had no second trimester miscarriages.   -Unknown family history as patient is adopted. Cancer screening: Mammogram, Pap smear up-to-date. Workup:  Anticardiolipin antibody, antibeta 2 glycoprotein antibody, PT gene mutation, factor V Leiden: Negative Protein C, protein S: Normal Current treatment: Eliquis  5mg  BID   INTERVAL HISTORY: Tiffany Clark 43 y.o. female following for gonadal vein thrombosis.  She is currently taking Eliquis  with no complaints and is very compliant with it we discussed the results of thrombophilia testing and that everything is negative.  Also discussed that at this point recurrence of prolonged clot and would require 3 months of anticoagulation.  Patient continues to experience chronic fatigue related to vitamin D  deficiency.  Iron panel was normal and labs showed slight vitamin B12 deficiency and we discussed about it.  No abdominal pain, blood in stools, or bleeding episodes. She has a history of a hysterectomy and is not menstruating.   I have reviewed the past medical history, past surgical history, social history and family history with the patient   ALLERGIES:  is allergic to aleve [naproxen sodium], hydrocodone, naproxen, and latex.  MEDICATIONS:  Current Outpatient Medications  Medication Sig Dispense Refill   apixaban  (ELIQUIS ) 5 MG TABS tablet Take 2 tablets (10mg ) twice daily for 7 days, then 1 tablet (5mg ) twice daily 60 tablet 2   Cholecalciferol (VITAMIN D3) 50 MCG (2000 UT) capsule Take 2,000 Units by mouth daily.     ibuprofen  (ADVIL ) 800 MG tablet Take 1 tablet (800 mg total) by mouth every 8 (eight) hours as needed. 30 tablet 0   traMADol  HCl 25 MG TABS Take 1 tablet by mouth every 6 (six) hours as needed.     No current facility-administered  medications for this visit.     REVIEW OF SYSTEMS:   Constitutional: Denies fevers, chills or night sweats Eyes: Denies blurriness of vision Ears, nose, mouth, throat,  and face: Denies mucositis or sore throat Respiratory: Denies cough, dyspnea or wheezes Cardiovascular: Denies palpitation, chest discomfort or lower extremity swelling Gastrointestinal:  Denies nausea, heartburn or change in bowel habits Skin: Denies abnormal skin rashes Lymphatics: Denies new lymphadenopathy or easy bruising Neurological:Denies numbness, tingling or new weaknesses Behavioral/Psych: Mood is stable, no new changes  All other systems were reviewed with the patient and are negative.  PHYSICAL EXAMINATION:   Vitals:   08/01/23 0823  BP: 121/78  Pulse: 72  Resp: 16  Temp: 98.2 F (36.8 C)  SpO2: 97%    GENERAL:alert, no distress and comfortable SKIN: skin color, texture, turgor are normal, no rashes or significant lesions LUNGS: clear to auscultation and percussion with normal breathing effort HEART: regular rate & rhythm and no murmurs and no lower extremity edema ABDOMEN:abdomen soft, non-tender and normal bowel sounds Musculoskeletal:no cyanosis of digits and no clubbing  NEURO: alert & oriented x 3 with fluent speech  LABORATORY DATA:  I have reviewed the data as listed  Lab Results  Component Value Date   WBC 7.5 07/11/2023   NEUTROABS 5.0 07/11/2023   HGB 13.6 07/11/2023   HCT 40.9 07/11/2023   MCV 89.1 07/11/2023   PLT 391 07/11/2023      Chemistry      Component Value Date/Time   NA 136 07/11/2023 0837   NA 138 08/02/2022 1001   K 3.9 07/11/2023 0837   CL 103 07/11/2023 0837   CO2 23 07/11/2023 0837   BUN 21 (H) 07/11/2023 0837   BUN 11 08/02/2022 1001   CREATININE 0.72 07/11/2023 0837      Component Value Date/Time   CALCIUM 9.5 07/11/2023 0837   ALKPHOS 68 07/11/2023 0837   AST 16 07/11/2023 0837   ALT 17 07/11/2023 0837   BILITOT 0.5 07/11/2023 0837   BILITOT 0.5 08/02/2022 1001       Latest Reference Range & Units 07/11/23 08:37  Iron 28 - 170 ug/dL 64  UIBC ug/dL 811  TIBC 914 - 782 ug/dL 956  Saturation Ratios 10.4 -  31.8 % 20  Ferritin 11 - 307 ng/mL 69  Folate >5.9 ng/mL 13.6  Vitamin B12 180 - 914 pg/mL 393    Latest Reference Range & Units 07/11/23 08:37  Anticardiolipin Ab,IgA,Qn 0 - 11 APL U/mL <9  Anticardiolipin Ab,IgG,Qn 0 - 14 GPL U/mL <9  Anticardiolipin Ab,IgM,Qn 0 - 12 MPL U/mL <9  PTT Lupus Anticoagulant 0.0 - 43.5 sec 30.3  DRVVT 0.0 - 47.0 sec 49.5 (H)  Lupus Anticoag Interp  Comment: (C)  Beta-2  Glycoprotein I Ab, IgG 0 - 20 GPI IgG units <9  Beta-2 -Glycoprotein I IgA 0 - 25 GPI IgA units <9  Beta-2 -Glycoprotein I IgM 0 - 32 GPI IgM units <9  Antithrombin Activity 75 - 120 % 112  (H): Data is abnormally high (C): Corrected Lupus Anticoag Interp Comment: VC  Comment: (NOTE) No lupus anticoagulant was detected. These results are consistent with specific inhibitors to one or more common pathway factors (X, V, II or fibrinogen ).    07/11/23 08:37  Recommendations-F5LEID: Negative  Recommendations-PTGENE: Negative    Latest Reference Range & Units 07/11/23 08:37  Protein C-Functional 73 - 180 % 115  Protein C, Total 60 - 150 % 97  Protein S-Functional 63 - 140 % 98  Protein S, Total 60 - 150 % 107  APTT 24 - 36 seconds 25  PTT Lupus Anticoagulant 0.0 - 43.5 sec 30.3

## 2023-08-08 ENCOUNTER — Ambulatory Visit (HOSPITAL_COMMUNITY)
Admission: RE | Admit: 2023-08-08 | Discharge: 2023-08-08 | Disposition: A | Discharge status: Home / Self Care | Source: Ambulatory Visit | Attending: Internal Medicine | Admitting: Internal Medicine

## 2023-08-08 ENCOUNTER — Encounter (HOSPITAL_COMMUNITY): Payer: Self-pay

## 2023-08-08 ENCOUNTER — Ambulatory Visit (INDEPENDENT_AMBULATORY_CARE_PROVIDER_SITE_OTHER): Admitting: Sports Medicine

## 2023-08-08 ENCOUNTER — Encounter: Payer: Self-pay | Admitting: Sports Medicine

## 2023-08-08 ENCOUNTER — Other Ambulatory Visit: Payer: Self-pay

## 2023-08-08 DIAGNOSIS — M25511 Pain in right shoulder: Secondary | ICD-10-CM | POA: Diagnosis not present

## 2023-08-08 DIAGNOSIS — M7501 Adhesive capsulitis of right shoulder: Secondary | ICD-10-CM

## 2023-08-08 DIAGNOSIS — Z1231 Encounter for screening mammogram for malignant neoplasm of breast: Secondary | ICD-10-CM | POA: Diagnosis not present

## 2023-08-08 DIAGNOSIS — G8929 Other chronic pain: Secondary | ICD-10-CM

## 2023-08-08 MED ORDER — METHYLPREDNISOLONE ACETATE 40 MG/ML IJ SUSP
80.0000 mg | INTRAMUSCULAR | Status: AC | PRN
  Administered 2023-08-08: 80 mg via INTRA_ARTICULAR

## 2023-08-08 MED ORDER — LIDOCAINE HCL 1 % IJ SOLN
3.0000 mL | INTRAMUSCULAR | Status: AC | PRN
Start: 2023-08-08 — End: 2023-08-08
  Administered 2023-08-08: 3 mL

## 2023-08-08 MED ORDER — BUPIVACAINE HCL 0.25 % IJ SOLN
3.0000 mL | INTRAMUSCULAR | Status: AC | PRN
  Administered 2023-08-08: 3 mL via INTRA_ARTICULAR

## 2023-08-08 NOTE — Progress Notes (Signed)
 Tiffany Clark - 43 y.o. female MRN [id number]  Date of birth: [birth year]  Office Visit Note: Visit Date: 08/08/2023 PCP: Tiffany Sport, MD Referred by: Tiffany Frater, MD  Subjective: Chief Complaint  Patient presents with   Right Shoulder - Pain   HPI: Tiffany Clark is a pleasant 43 y.o. female who presents today for chronic right shoulder pain with adhesive capsulitis.  Meris has been having right shoulder pain since October of last year.  There is no specific injury to the shoulder.  She did become somewhat recumbent and stationary after she had a hysterectomy around this time for about 6 weeks.  She has pain deep within the shoulder and the anterior joint recess which is quite bothersome.  She has restriction in range of motion as well.  She is using heat for the shoulder.  She has taken Tylenol  without much relief.  Dr. Ernesta Clark did perform a glenohumeral joint injection back on 4//25 which did give her at least 25% relief in terms of her pain and small improvements in her range of motion.  She was sent here for evaluation and possible series of high-volume injections given her frozen shoulder.  She is not diabetic, no known thyroid conditions.  Pertinent ROS were reviewed with the patient and found to be negative unless otherwise specified above in HPI.   Assessment & Plan: Visit Diagnoses:  1. Adhesive capsulitis of right shoulder   2. Chronic right shoulder pain    Plan: Impression is chronic right shoulder pain in the setting of adhesive capsulitis. She did receive about 25% improvement in pain and range of motion benefit after a regular glenohumeral joint injection with Dr. Ernesta Clark.  We discussed the nature and pathophysiology of frozen shoulder.  Through shared decision making, we did proceed with high-volume glenohumeral joint injection under ultrasound guidance.  We attempted to place 18 cc of fluid within the shoulder joint, due to pain with higher volume injection, we  were able to place 15cc of injectate today to distend the capsule.  She may use Tylenol , ice/heat for any postinjection pain.  Did discuss with her she may be sore over the next few days.  I would like her to get started physical therapy for the shoulder with a frozen shoulder protocol.  We did print out [employer] handout for this today and my athletic trainer, Tiffany Clark, did review these with her in the room today.  Starting on Sunday she may begin these performing at least twice to 3 times daily.  I will see her back in 2 weeks for reevaluation.  Depending on her degree of relief we may consider additional high-volume injection. Will ultimately get her back to seeing Dr. Ernesta Clark.  Follow-up: Return in about 2 weeks (around 08/22/2023) for For R-frozen shoulder (30-mins for poss US -inj).   Meds & Orders: No orders of the defined types were placed in this encounter.   Orders Placed This Encounter  Procedures   Large Joint Inj   US  Guided Needle Placement - No Linked Charges     Procedures: Large Joint Inj: R glenohumeral on 08/08/2023 10:33 AM Indications: pain Details: 22 G 3.5 in needle, ultrasound-guided posterior approach Medications: 3 mL lidocaine  1 %; 3 mL bupivacaine  0.25 %; 80 mg methylPREDNISolone acetate 40 MG/ML (5cc NS 5cc D50W) Outcome: tolerated well, no immediate complications  US -guided glenohumeral joint high-volume injection, right shoulder After discussion on risks/benefits/indications, informed verbal consent was obtained. A timeout was then performed. The patient was  positioned lying lateral recumbent on examination table. The patient's shoulder was prepped with betadine and multiple alcohol swabs and utilizing ultrasound guidance, the patient's glenohumeral joint was identified on ultrasound. Using ultrasound guidance a 22-gauge, 3.5 inch needle with a mixture of 3:3:5:5:2 cc's (total injectate 18cc - we were only able to get about 15 cc within joint) lidocaine :bupivicaine:D50w:normal  saline:depomedrol was directed from a lateral to medial direction via in-plane technique into the glenohumeral joint with visualization of appropriate spread of injectate into the joint. Patient tolerated the procedure well without immediate complications.  Procedure, treatment alternatives, risks and benefits explained, specific risks discussed. Consent was given by the patient. Immediately prior to procedure a time out was called to verify the correct patient, procedure, equipment, support staff and site/side marked as required. Patient was prepped and draped in the usual sterile fashion.          Clinical History: No specialty comments available.  She reports that she quit smoking about 18 years ago. Her smoking use included cigarettes. She started smoking about 24 years ago. She has a 4.5 pack-year smoking history. She has never used smokeless tobacco. No results for input(s): "HGBA1C", "LABURIC" in the last 8760 hours.  Objective:   Vital Signs: LMP 06/03/2013   Physical Exam  Gen: Well-appearing, in no acute distress; non-toxic CV: Well-perfused. Warm.  Resp: Breathing unlabored on room air; no wheezing. Psych: Fluid speech in conversation; appropriate affect; normal thought process  Ortho Exam - Right shoulder: There is restricted active and passive range of motion in all directions. No swelling or effusion noted.   ROM as follows: - Flex: 120 degrees - Abd: 95-100 degrees - ER: 25-30 degrees - IR: Thumb to lateral hip (GT)  Imaging:  *I did review 3 view shoulder x-ray from 06/13/2023 and agree with below findings.  Narrative & Impression  X-rays of the right shoulder were obtained in clinic today.  No acute injuries noted.  Well-maintained glenohumeral joint space.  No proximal humeral migration.  No osteophytes are appreciated.  No bony lesions.   Impression: Negative right shoulder x-ray      Past Medical/Family/Surgical/Social History: Medications & Allergies  reviewed per EMR, new medications updated. Patient Active Problem List   Diagnosis Date Noted   Vitamin B12 deficiency 08/01/2023   Venous thromboembolism 07/11/2023   Fatigue 07/11/2023   Pelvic pain 03/12/2023   Chronic pelvic pain in female 03/12/2023   Vitamin D  deficiency 07/19/2022   Prediabetes 07/19/2022   Chest pain 07/19/2022   GAD (generalized anxiety disorder) 12/31/2021   Encounter for general adult medical examination with abnormal findings 07/13/2021   Gastroesophageal reflux disease 07/13/2020   Pain in female genitalia on intercourse 07/13/2020   Stress incontinence (female) (female) 07/13/2020   Uterine prolapse 07/13/2020   Mixed hyperlipidemia 07/13/2020   History of nephrolithiasis 01/11/2020   Obesity (BMI 30-39.9) 01/11/2020   Gestational diabetes mellitus in childbirth, diet controlled    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 and of unspecified site    Intrauterine normal pregnancy 04/29/2014  Missed ab    x 3 - no surgery required    Other abnormalities in shape or position of gravid uterus and of neighboring structures, antepartum    prolapse during pregnancy   SVD (spontaneous vaginal delivery)    x 3   Threatened abortion in first trimester 07/24/2013   Urinary retention 10/03/2011   Family History  Adopted: Yes  Problem Relation Age of Onset   Other Neg Hx    Past Surgical History:  Procedure Laterality Date   CARPAL TUNNEL RELEASE     Right  WRIST ONLY   CESAREAN SECTION N/A 04/29/2014   Procedure: CESAREAN SECTION;  Surgeon: Christel Cousins, DO;  Location: [facility];  Service: Obstetrics;  Laterality:  N/A;   CESAREAN SECTION N/A    [employer]ia 01/10/2020   LITHOTRIPSY     x 1   ROBOTIC ASSISTED TOTAL HYSTERECTOMY WITH BILATERAL SALPINGO OOPHERECTOMY Bilateral 03/12/2023   Procedure: XI ROBOTIC ASSISTED TOTAL HYSTERECTOMY WITH BILATERAL SALPINGO;  Surgeon: Tiffany Sport, DO;  Location: [facility] OR;  Service: Gynecology;  Laterality: Bilateral;   WISDOM TOOTH EXTRACTION     Social History   Occupational History    Employer: [employer]    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  Tobacco Use   Smoking status: Former    Current packs/day: 0.00    Average packs/day: 0.8 packs/day for 6.0 years (4.5 ttl pk-yrs)    Types: Cigarettes    Start date: 10/31/1998    Quit date: 10/30/2004    Years since quitting: 18.7   Smokeless tobacco: Never  Vaping Use   Vaping status: Never Used  Substance and Sexual Activity   Alcohol use: No   Drug use: No   Sexual activity: Yes    Birth control/protection: None    Comment: pregnant

## 2023-08-08 NOTE — Progress Notes (Signed)
 Patient says that she began having pain in the right shoulder since October that got worse after she was resting and stationary for 6 weeks after a hysterectomy. She says that it has gotten a bit better but she is still very restricted in ROM. She is doing some home stretching. She is here today for a higher volume injection.  Patient was instructed in 10 minutes of therapeutic exercises for right shoulder to improve strength, ROM and function according to my instructions and plan of care by a Certified Athletic Trainer during the office visit. A customized handout was provided and demonstration of proper technique shown and discussed. Patient did perform exercises and demonstrate understanding through teachback.  All questions discussed and answered.

## 2023-08-12 ENCOUNTER — Encounter (HOSPITAL_COMMUNITY): Payer: Self-pay | Admitting: Obstetrics and Gynecology

## 2023-08-13 ENCOUNTER — Other Ambulatory Visit (HOSPITAL_COMMUNITY): Payer: Self-pay | Admitting: Obstetrics and Gynecology

## 2023-08-13 DIAGNOSIS — R928 Other abnormal and inconclusive findings on diagnostic imaging of breast: Secondary | ICD-10-CM

## 2023-08-29 ENCOUNTER — Encounter: Payer: Self-pay | Admitting: Sports Medicine

## 2023-08-29 ENCOUNTER — Ambulatory Visit: Admitting: Sports Medicine

## 2023-08-29 DIAGNOSIS — M7501 Adhesive capsulitis of right shoulder: Secondary | ICD-10-CM

## 2023-08-29 DIAGNOSIS — M25511 Pain in right shoulder: Secondary | ICD-10-CM

## 2023-08-29 DIAGNOSIS — G8929 Other chronic pain: Secondary | ICD-10-CM | POA: Diagnosis not present

## 2023-08-29 NOTE — Progress Notes (Signed)
 Tiffany Clark - 44 y.o. female MRN [id number]  Date of birth: 1980-04-28  Office Visit Note: Visit Date: 08/29/2023 PCP: Meldon Sport, MD Referred by: Meldon Sport, MD  Subjective: Chief Complaint  Patient presents with   Right Shoulder - Follow-up   HPI: Tiffany Clark is a pleasant 43 y.o. female who presents today for follow-up of chronic right shoulder pain with adhesive capsulitis.  Back on 08/08/2023 we did proceed with large-volume ultrasound-guided injection into the joint space.  She had rather severe pain that day and the following day, but then following this she really started having good relief both in her pain and improvement in her range of motion.  She has been very adherent to her adhesive capsulitis home rehab exercise protocol and does notice that her shoulder is able to move more with improvement in range of motion.  She feels she is at least 75-80% better from that injection.  She is not having to take any medications given her pain relief.  Pertinent ROS were reviewed with the patient and found to be negative unless otherwise specified above in HPI.   Assessment & Plan: Visit Diagnoses:  1. Adhesive capsulitis of right shoulder   2. Chronic right shoulder pain    Plan: Impression is chronic right shoulder pain with adhesive capsulitis that is vastly improved both in pain reduction and range of motion following high-volume glenohumeral joint injection.  She has been adherent to her home rehab adhesive capsulitis protocol and I would like her to continue this multiple times daily to further stretch out the capsule and improve her range of motion.  Given that she is 80% better or more, we will hold on further injection at this standpoint.  I would like to have her continue her HEP for 6 weeks and then reevaluate to see if she continues making improvements with range of motion.  We could consider an anterior glenohumeral joint injection underneath the coracohumeral  ligament versus additional large-volume into the joint itself if for some reason she plateaus or does not continue to improve as I would expect.  Follow-up in 6 weeks.  Follow-up: Return in about 6 weeks (around 10/10/2023) for for R-frozen shoulder Meds & Orders: No orders of the defined types were placed in this encounter.  No orders of the defined types were placed in this encounter.    Procedures: No procedures performed      Clinical History: No specialty comments available.  She reports that she quit smoking about 18 years ago. Her smoking use included cigarettes. She started smoking about 24 years ago. She has a 4.5 pack-year smoking history. She has never used smokeless tobacco. No results for input(s): "HGBA1C", "LABURIC" in the last 8760 hours.  Objective:   Vital Signs: LMP 06/03/2013   Physical Exam  Gen: Well-appearing, in no acute distress; non-toxic CV: Well-perfused. Warm.  Resp: Breathing unlabored on room air; no wheezing. Psych: Fluid speech in conversation; appropriate affect; normal thought process  Ortho Exam - Right shoulder: There is no redness swelling or effusion.  There is marked improvement in both active and passive range of motion in all directions.  ROM as follows: - Flex: 120 --> 165 degrees - Abd: 95-100 --> 175 degrees - ER: 25-30 --> 45/50 degrees - IR: Thumb to lateral hip (GT) --> thumb to L4 lateral process  Imaging: No results found.  Past Medical/Family/Surgical/Social History: Medications & Allergies reviewed per EMR, new medications updated. Patient Active Problem List  Diagnosis Date Noted   Vitamin B12 deficiency 08/01/2023   Venous thromboembolism 07/11/2023   Fatigue 07/11/2023   Pelvic pain 03/12/2023   Chronic pelvic pain in female 03/12/2023   Vitamin D  deficiency 07/19/2022   Prediabetes 07/19/2022   Chest pain 07/19/2022   GAD (generalized anxiety disorder) 12/31/2021   Encounter for general adult medical examination  with abnormal findings 07/13/2021   Gastroesophageal reflux disease 07/13/2020   Pain in female genitalia on intercourse 07/13/2020   Stress incontinence (female) (female) 07/13/2020   Uterine prolapse 07/13/2020   Mixed hyperlipidemia 07/13/2020   History of nephrolithiasis 01/11/2020   Obesity (BMI 30-39.9) 01/11/2020   Gestational diabetes mellitus in childbirth, diet controlled    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 and of unspecified site    Intrauterine normal pregnancy 04/29/2014   Missed ab    x 3 - no surgery required    Other abnormalities in shape or position of gravid uterus and of neighboring structures, antepartum    prolapse during pregnancy   SVD (spontaneous vaginal delivery)    x 3   Threatened abortion in first trimester 07/24/2013   Urinary retention 10/03/2011   Family History  Adopted: Yes  Problem Relation Age of Onset   Other Neg Hx    Past Surgical History:  Procedure Laterality Date   CARPAL TUNNEL RELEASE     Right  WRIST ONLY   CESAREAN SECTION N/A 04/29/2014   Procedure: CESAREAN SECTION;  Surgeon: Christel Cousins, DO;  Location: [facility];  Service: Obstetrics;  Laterality: N/A;   CESAREAN SECTION N/A    Phreesia 01/10/2020   LITHOTRIPSY     x 1   ROBOTIC ASSISTED TOTAL HYSTERECTOMY WITH BILATERAL SALPINGO OOPHERECTOMY Bilateral 03/12/2023   Procedure: XI ROBOTIC ASSISTED TOTAL HYSTERECTOMY WITH BILATERAL SALPINGO;  Surgeon: Meldon Sport, DO;  Location: [facility];  Service: Gynecology;  Laterality: Bilateral;   WISDOM TOOTH EXTRACTION     Social History   Occupational History    Employer: [employer]     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

## 2023-08-29 NOTE — Progress Notes (Signed)
 Patient says that her pain is much improved and her ROM has improved as well. She says that the day of her injection and the next day she did have severe pain, but beginning that Sunday noticed some relief and improvement in her motion. She does still feel restricted, but has been able to do her exercises more than she was prior to her injection.

## 2023-09-04 ENCOUNTER — Ambulatory Visit (HOSPITAL_COMMUNITY)
Admission: RE | Admit: 2023-09-04 | Discharge: 2023-09-04 | Disposition: A | Discharge status: Home / Self Care | Source: Ambulatory Visit | Attending: Obstetrics and Gynecology | Admitting: Obstetrics and Gynecology

## 2023-09-04 ENCOUNTER — Ambulatory Visit (HOSPITAL_COMMUNITY)
Admission: RE | Admit: 2023-09-04 | Discharge: 2023-09-04 | Disposition: A | Discharge status: Home / Self Care | Source: Ambulatory Visit | Attending: Obstetrics and Gynecology

## 2023-09-04 DIAGNOSIS — R928 Other abnormal and inconclusive findings on diagnostic imaging of breast: Secondary | ICD-10-CM

## 2023-09-04 DIAGNOSIS — N6312 Unspecified lump in the right breast, upper inner quadrant: Secondary | ICD-10-CM | POA: Diagnosis not present

## 2023-09-04 DIAGNOSIS — R92321 Mammographic fibroglandular density, right breast: Secondary | ICD-10-CM | POA: Diagnosis not present

## 2023-09-12 ENCOUNTER — Inpatient Hospital Stay: Admitting: Oncology

## 2023-09-18 ENCOUNTER — Other Ambulatory Visit (HOSPITAL_COMMUNITY)

## 2023-09-18 ENCOUNTER — Encounter (HOSPITAL_COMMUNITY)

## 2023-10-10 ENCOUNTER — Ambulatory Visit: Admitting: Sports Medicine

## 2023-11-25 ENCOUNTER — Other Ambulatory Visit (HOSPITAL_COMMUNITY): Payer: Self-pay

## 2023-11-25 ENCOUNTER — Ambulatory Visit (INDEPENDENT_AMBULATORY_CARE_PROVIDER_SITE_OTHER): Payer: Self-pay | Admitting: Internal Medicine

## 2023-11-25 ENCOUNTER — Telehealth: Payer: Self-pay | Admitting: Pharmacy Technician

## 2023-11-25 ENCOUNTER — Encounter: Payer: Self-pay | Admitting: Internal Medicine

## 2023-11-25 VITALS — BP 120/81 | HR 80 | Ht 63.0 in | Wt 202.8 lb

## 2023-11-25 DIAGNOSIS — Z0001 Encounter for general adult medical examination with abnormal findings: Secondary | ICD-10-CM | POA: Diagnosis not present

## 2023-11-25 DIAGNOSIS — E782 Mixed hyperlipidemia: Secondary | ICD-10-CM

## 2023-11-25 DIAGNOSIS — R7303 Prediabetes: Secondary | ICD-10-CM | POA: Diagnosis not present

## 2023-11-25 DIAGNOSIS — E559 Vitamin D deficiency, unspecified: Secondary | ICD-10-CM

## 2023-11-25 DIAGNOSIS — E669 Obesity, unspecified: Secondary | ICD-10-CM | POA: Diagnosis not present

## 2023-11-25 DIAGNOSIS — F411 Generalized anxiety disorder: Secondary | ICD-10-CM | POA: Diagnosis not present

## 2023-11-25 MED ORDER — PHENTERMINE HCL 37.5 MG PO TABS: 37.5000 mg | ORAL_TABLET | Freq: Every day | ORAL | 3 refills | Status: DC

## 2023-11-25 NOTE — Telephone Encounter (Signed)
 Pharmacy Patient Advocate Encounter   Received notification from CoverMyMeds that prior authorization for Phentermine  HCl 37.5MG  tablets is required/requested.   Insurance verification completed.   The patient is insured through [facility] .   Per test claim: PA required; PA submitted to above mentioned insurance via Latent Key/confirmation #/EOC [id number] Status is pending

## 2023-11-25 NOTE — Assessment & Plan Note (Signed)
 Lab Results  Component Value Date   HGBA1C 5.7 (H) 08/02/2022   Advised to follow low carb diet for now

## 2023-11-25 NOTE — Patient Instructions (Signed)
 Please start taking Phentermine half tablet for 2 weeks and then 1 tablet once daily.  Please continue to take medications as prescribed.  Please continue to follow low carb diet and perform moderate exercise/walking at least 150 mins/week.

## 2023-11-25 NOTE — Assessment & Plan Note (Signed)
 Last vitamin D  Lab Results  Component Value Date   VD25OH 20.3 (L) 08/02/2022   Advised to take Vitamin D  2000 IU QD

## 2023-11-25 NOTE — Progress Notes (Signed)
 Established Patient Office Visit  Subjective:  Patient ID: Tiffany Clark, female    DOB: 12-31-1980  Age: 43 y.o. MRN: [id number]  CC:  Chief Complaint  Patient presents with   Annual Exam    Cpe.    Obesity    Pt has tried weight watcher, high protein diet and is stuck on the current weight.     HPI Tiffany Clark is a 43 y.o. female with past medical history of MDD, nephrolithiasis and gestational diabetes who presents for annual physical.  She was having stress at work and home. She was placed on Prozac  in the past, but has decided to avoid medicine for now. 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  She denies any change in appetite.  Denies any SI or HI currently.  Obesity: She has been struggling to lose weight despite improving her diet.  She has tried weight watchers and has been following high-protein diet for the last 1 year.  She is motivated to continue to follow low-carb diet and physical activity for now.  She wants to discuss medical weight loss options.  I had lengthy discussion about possible medical weight loss options.   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 and of unspecified site    Intrauterine normal pregnancy 04/29/2014   Missed ab    x 3 - no surgery required    Other abnormalities in shape or position of gravid uterus and of neighboring structures, antepartum    prolapse during pregnancy   SVD (spontaneous vaginal delivery)    x 3   Threatened abortion in first trimester 07/24/2013   Urinary retention 10/03/2011     Past Surgical History:  Procedure Laterality Date   CARPAL TUNNEL RELEASE     Right  WRIST ONLY   CESAREAN SECTION N/A 04/29/2014   Procedure: CESAREAN SECTION;  Surgeon: Delon CHRISTELLA Prude, DO;  Location: [facility];  Service: Obstetrics;  Laterality: N/A;   CESAREAN SECTION N/A    Phreesia 01/10/2020   LITHOTRIPSY     x 1   ROBOTIC ASSISTED TOTAL HYSTERECTOMY WITH BILATERAL SALPINGO OOPHERECTOMY Bilateral 03/12/2023   Procedure: XI ROBOTIC ASSISTED TOTAL HYSTERECTOMY WITH BILATERAL SALPINGO;  Surgeon: Storm Setter, DO;  Location: [facility];  Service: Gynecology;  Laterality: Bilateral;   WISDOM TOOTH EXTRACTION      Family History  Adopted: Yes  Problem Relation Age of Onset   Other Neg Hx     Social History   Socioeconomic History   Marital status: Married    Spouse name: Not on file   Number of children: 4   Years of education: Not on file   Highest education level: Bachelor's degree (e.g., BA, AB, BS)  Occupational History    Employer: [employer]    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  Tobacco Use   Smoking status: Former    Current packs/day: 0.00    Average packs/day: 0.8 packs/day for 6.0 years (4.5 ttl pk-yrs)  Types: Cigarettes    Start date: 10/31/1998    Quit date: 10/30/2004    Years since quitting: 19.0   Smokeless tobacco: Never  Vaping Use   Vaping status: Never Used  Substance and Sexual Activity   Alcohol use: No   Drug use: No   Sexual activity: Yes    Birth control/protection: None    Comment: pregnant  Other Topics Concern   Not on file  Social History Narrative   Not on file   Social Drivers of Health   Financial Resource Strain: Low Risk  (11/24/2023)   Overall Financial Resource Strain (CARDIA)    Difficulty of Paying Living Expenses: Not hard at all  Food Insecurity: No Food Insecurity (11/24/2023)   Hunger Vital Sign    Worried About Running Out of Food in the Last Year: Never true    Ran Out of Food in the Last Year: Never true  Transportation  Needs: No Transportation Needs (11/24/2023)   PRAPARE - Administrator, Civil Service (Medical): No    Lack of Transportation (Non-Medical): No  Physical Activity: Insufficiently Active (11/24/2023)   Exercise Vital Sign    Days of Exercise per Week: 3 days    Minutes of Exercise per Session: 30 min  Stress: Stress Concern Present (11/24/2023)   [employer] of Occupational Health - Occupational Stress Questionnaire    Feeling of Stress: To some extent  Social Connections: Moderately Integrated (11/24/2023)   Social Connection and Isolation Panel    Frequency of Communication with Friends and Family: More than three times a week    Frequency of Social Gatherings with Friends and Family: Once a week    Attends Religious Services: More than 4 times per year    Active Member of [employer] or Organizations: No    Attends Banker Meetings: Not on file    Marital Status: Married  Catering manager Violence: Not At Risk (07/11/2023)   Humiliation, Afraid, Rape, and Kick questionnaire    Fear of Current or Ex-Partner: No    Emotionally Abused: No    Physically Abused: No    Sexually Abused: No    Outpatient Medications Prior to Visit  Medication Sig Dispense Refill   Cholecalciferol (VITAMIN D3) 50 MCG (2000 UT) capsule Take 2,000 Units by mouth daily.     ibuprofen  (ADVIL ) 800 MG tablet Take 1 tablet (800 mg total) by mouth every 8 (eight) hours as needed. 30 tablet 0   apixaban  (ELIQUIS ) 5 MG TABS tablet Take 2 tablets (10mg ) twice daily for 7 days, then 1 tablet (5mg ) twice daily 60 tablet 2   traMADol  HCl 25 MG TABS Take 1 tablet by mouth every 6 (six) hours as needed.     No facility-administered medications prior to visit.    Allergies  Allergen Reactions   Aleve [Naproxen Sodium]     Heart races and sweaty palms. Feels like panic attack. Can take Ibuprofen    Hydrocodone Nausea And Vomiting   Naproxen     Other reaction(s): Unknown   Latex Rash    Other  reaction(s): Unknown    ROS Review of Systems  Constitutional:  Negative for chills and fever.  HENT:  Negative for congestion, sinus pressure, sinus pain and sore throat.   Eyes:  Negative for pain and discharge.  Respiratory:  Negative for cough and shortness of breath.   Cardiovascular:  Negative for chest pain and palpitations.  Gastrointestinal:  Negative for abdominal pain, diarrhea, nausea and  vomiting.  Endocrine: Negative for polydipsia and polyuria.  Genitourinary:  Negative for dysuria and hematuria.  Musculoskeletal:  Negative for neck pain and neck stiffness.  Skin:  Negative for rash.  Neurological:  Negative for dizziness and weakness.  Psychiatric/Behavioral:  Negative for agitation and behavioral problems. The patient is nervous/anxious.       Objective:    Physical Exam Vitals reviewed.  Constitutional:      General: She is not in acute distress.    Appearance: She is obese. She is not diaphoretic.  HENT:     Head: Normocephalic and atraumatic.  Eyes:     General: No scleral icterus.    Extraocular Movements: Extraocular movements intact.  Cardiovascular:     Rate and Rhythm: Normal rate and regular rhythm.     Heart sounds: Normal heart sounds. No murmur heard. Pulmonary:     Breath sounds: Normal breath sounds. No wheezing or rales.  Abdominal:     Palpations: Abdomen is soft.     Tenderness: There is no abdominal tenderness.  Musculoskeletal:     Cervical back: Neck supple. No tenderness.     Right lower leg: No edema.     Left lower leg: No edema.  Skin:    General: Skin is warm.     Findings: No rash.  Neurological:     General: No focal deficit present.     Mental Status: She is alert and oriented to person, place, and time.     Cranial Nerves: No cranial nerve deficit.     Sensory: No sensory deficit.     Motor: No weakness.  Psychiatric:        Mood and Affect: Mood normal.        Behavior: Behavior normal.     BP 120/81   Pulse 80    Ht 5' 3 (1.6 m)   Wt 202 lb 12.8 oz (92 kg)   LMP 06/03/2013   SpO2 98%   BMI 35.92 kg/m  Wt Readings from Last 3 Encounters:  11/25/23 202 lb 12.8 oz (92 kg)  08/01/23 197 lb 6.4 oz (89.5 kg)  07/11/23 199 lb 1.2 oz (90.3 kg)    Lab Results  Component Value Date   TSH 1.130 08/02/2022   Lab Results  Component Value Date   WBC 7.5 07/11/2023   HGB 13.6 07/11/2023   HCT 40.9 07/11/2023   MCV 89.1 07/11/2023   PLT 391 07/11/2023   Lab Results  Component Value Date   NA 136 07/11/2023   K 3.9 07/11/2023   CO2 23 07/11/2023   GLUCOSE 114 (H) 07/11/2023   BUN 21 (H) 07/11/2023   CREATININE 0.72 07/11/2023   BILITOT 0.5 07/11/2023   ALKPHOS 68 07/11/2023   AST 16 07/11/2023   ALT 17 07/11/2023   PROT 7.8 07/11/2023   ALBUMIN 3.8 07/11/2023   CALCIUM 9.5 07/11/2023   ANIONGAP 10 07/11/2023   EGFR 88 08/02/2022   Lab Results  Component Value Date   CHOL 183 08/02/2022   Lab Results  Component Value Date   HDL 36 (L) 08/02/2022   Lab Results  Component Value Date   LDLCALC 123 (H) 08/02/2022   Lab Results  Component Value Date   TRIG 133 08/02/2022   Lab Results  Component Value Date   CHOLHDL 5.1 (H) 08/02/2022   Lab Results  Component Value Date   HGBA1C 5.7 (H) 08/02/2022      Assessment & Plan:   Problem List Items  Addressed This Visit       Other   Obesity (BMI 30-39.9)   BMI Readings from Last 3 Encounters:  11/25/23 35.92 kg/m  08/01/23 34.97 kg/m  07/11/23 35.26 kg/m   Advised to follow low-carb diet and perform moderate exercise/walking at least 150 minutes/week Discussed different medical weight loss options, including their benefits and side effects - she would benefit from [facility] or Zepbound, but cost is a concern Started phentermine  37.5 mg once daily -advised to start with half tablet QD for 2 weeks and then increase to 1 tablet QD      Relevant Medications   phentermine  (ADIPEX-P ) 37.5 MG tablet   Other Relevant  Orders   CMP14+EGFR   CBC with Differential/Platelet   Mixed hyperlipidemia   Relevant Orders   Lipid panel   Encounter for general adult medical examination with abnormal findings - Primary   Physical exam as documented. Fasting blood tests ordered. Followed by [facility]. for pelvic exam and Mammography. Has had hysterectomy.      GAD (generalized anxiety disorder)   Relevant Orders   TSH   CMP14+EGFR   CBC with Differential/Platelet   Vitamin D  deficiency   Last vitamin D  Lab Results  Component Value Date   VD25OH 20.3 (L) 08/02/2022   Advised to take Vitamin D  2000 IU QD      Relevant Orders   VITAMIN D  25 Hydroxy (Vit-D Deficiency, Fractures)   Prediabetes   Lab Results  Component Value Date   HGBA1C 5.7 (H) 08/02/2022   Advised to follow low carb diet for now      Relevant Orders   Microalbumin / creatinine urine ratio   Hemoglobin A1c   CMP14+EGFR    Meds ordered this encounter  Medications   phentermine  (ADIPEX-P ) 37.5 MG tablet    Sig: Take 1 tablet (37.5 mg total) by mouth daily before breakfast.    Dispense:  30 tablet    Refill:  3    Follow-up: Return in about 3 months (around 02/25/2024) for Weight management.    Suzzane MARLA Blanch, MD

## 2023-11-25 NOTE — Telephone Encounter (Signed)
 Pharmacy Patient Advocate Encounter  Received notification from [facility] that Prior Authorization for Phentermine  HCl 37.5MG  tablets has been APPROVED from 11/25/2023 to 02/25/2024. Ran test claim, Copay is $0.98. This test claim was processed through [facility]- copay amounts may vary at other pharmacies due to pharmacy/plan contracts, or as the patient moves through the different stages of their insurance plan.   PA #/Case ID/Reference #: [id number]

## 2023-11-25 NOTE — Assessment & Plan Note (Signed)
 Physical exam as documented. Fasting blood tests ordered. Followed by [facility]. for pelvic exam and Mammography. Has had hysterectomy.

## 2023-11-25 NOTE — Assessment & Plan Note (Signed)
 BMI Readings from Last 3 Encounters:  11/25/23 35.92 kg/m  08/01/23 34.97 kg/m  07/11/23 35.26 kg/m   Advised to follow low-carb diet and perform moderate exercise/walking at least 150 minutes/week Discussed different medical weight loss options, including their benefits and side effects - she would benefit from [facility] or Zepbound, but cost is a concern Started phentermine  37.5 mg once daily -advised to start with half tablet QD for 2 weeks and then increase to 1 tablet QD

## 2023-11-26 ENCOUNTER — Ambulatory Visit: Payer: Self-pay | Admitting: Internal Medicine

## 2023-11-26 LAB — CMP14+EGFR
ALT: 27 IU/L (ref 0–32)
AST: 22 IU/L (ref 0–40)
Albumin: 4.4 g/dL (ref 3.9–4.9)
Alkaline Phosphatase: 89 IU/L (ref 44–121)
BUN/Creatinine Ratio: 22 (ref 9–23)
BUN: 17 mg/dL (ref 6–24)
Bilirubin Total: 0.2 mg/dL (ref 0.0–1.2)
CO2: 21 mmol/L (ref 20–29)
Calcium: 9.9 mg/dL (ref 8.7–10.2)
Chloride: 100 mmol/L (ref 96–106)
Creatinine, Ser: 0.77 mg/dL (ref 0.57–1.00)
Globulin, Total: 3.2 g/dL (ref 1.5–4.5)
Glucose: 94 mg/dL (ref 70–99)
Potassium: 4.1 mmol/L (ref 3.5–5.2)
Sodium: 137 mmol/L (ref 134–144)
Total Protein: 7.6 g/dL (ref 6.0–8.5)
eGFR: 98 mL/min/1.73 (ref >59)

## 2023-11-26 LAB — LIPID PANEL
Chol/HDL Ratio: 4.8 ratio — ABNORMAL HIGH (ref 0.0–4.4)
Cholesterol, Total: 197 mg/dL (ref 100–199)
HDL: 41 mg/dL (ref >39)
LDL Chol Calc (NIH): 123 mg/dL — ABNORMAL HIGH (ref 0–99)
Triglycerides: 189 mg/dL — ABNORMAL HIGH (ref 0–149)
VLDL Cholesterol Cal: 33 mg/dL (ref 5–40)

## 2023-11-26 LAB — CBC WITH DIFFERENTIAL/PLATELET
Basophils Absolute: 0.1 x10E3/uL (ref 0.0–0.2)
Basos: 1 %
EOS (ABSOLUTE): 0.1 x10E3/uL (ref 0.0–0.4)
Eos: 1 %
Hematocrit: 41.6 % (ref 34.0–46.6)
Hemoglobin: 13.7 g/dL (ref 11.1–15.9)
Immature Grans (Abs): 0 x10E3/uL (ref 0.0–0.1)
Immature Granulocytes: 0 %
Lymphocytes Absolute: 2.7 x10E3/uL (ref 0.7–3.1)
Lymphs: 27 %
MCH: 30 pg (ref 26.6–33.0)
MCHC: 32.9 g/dL (ref 31.5–35.7)
MCV: 91 fL (ref 79–97)
Monocytes Absolute: 0.7 x10E3/uL (ref 0.1–0.9)
Monocytes: 8 %
Neutrophils Absolute: 6.1 x10E3/uL (ref 1.4–7.0)
Neutrophils: 63 %
Platelets: 422 x10E3/uL (ref 150–450)
RBC: 4.56 x10E6/uL (ref 3.77–5.28)
RDW: 12.3 % (ref 11.7–15.4)
WBC: 9.8 x10E3/uL (ref 3.4–10.8)

## 2023-11-26 LAB — VITAMIN D 25 HYDROXY (VIT D DEFICIENCY, FRACTURES): Vit D, 25-Hydroxy: 24.4 ng/mL — ABNORMAL LOW (ref 30.0–100.0)

## 2023-11-26 LAB — HEMOGLOBIN A1C
Est. average glucose Bld gHb Est-mCnc: 120 mg/dL
Hgb A1c MFr Bld: 5.8 % — ABNORMAL HIGH (ref 4.8–5.6)

## 2023-11-26 LAB — TSH: TSH: 2.27 u[IU]/mL (ref 0.450–4.500)

## 2023-11-27 LAB — MICROALBUMIN / CREATININE URINE RATIO
Creatinine, Urine: 68.8 mg/dL
Microalb/Creat Ratio: <4 mg/g{creat} (ref 0–29)
Microalbumin, Urine: <3 ug/mL

## 2024-02-02 ENCOUNTER — Encounter: Payer: Self-pay | Admitting: Radiology

## 2024-02-08 ENCOUNTER — Ambulatory Visit
Admission: EM | Admit: 2024-02-08 | Discharge: 2024-02-08 | Disposition: A | Discharge status: Home / Self Care | Attending: Nurse Practitioner | Admitting: Nurse Practitioner

## 2024-02-08 DIAGNOSIS — L237 Allergic contact dermatitis due to plants, except food: Secondary | ICD-10-CM | POA: Diagnosis not present

## 2024-02-08 MED ORDER — PREDNISONE 20 MG PO TABS: 40.0000 mg | ORAL_TABLET | Freq: Every day | ORAL | 0 refills | Status: AC

## 2024-02-08 MED ORDER — TRIAMCINOLONE ACETONIDE 0.1 % EX CREA: 1.0000 | TOPICAL_CREAM | Freq: Two times a day (BID) | CUTANEOUS | 0 refills | Status: AC

## 2024-02-08 MED ORDER — DEXAMETHASONE SOD PHOSPHATE PF 10 MG/ML IJ SOLN
10.0000 mg | Freq: Once | INTRAMUSCULAR | Status: AC
  Administered 2024-02-08: 10 mg via INTRAMUSCULAR

## 2024-02-08 NOTE — Discharge Instructions (Signed)
 You were given an injection of Decadron  10 mg.  Start the prednisone tomorrow. You may take over-the-counter Allegra, Claritin, or Zyrtec during the daytime to help with itching, Benadryl  at bedtime. Apply cool compresses to the area to help with inflammation and swelling. Avoid scratching or manipulating the areas while symptoms persist. Recommend over-the-counter Aveeno colloidal oatmeal bath to help with itching and drying of the rash. Continue to monitor symptoms for worsening.  You may follow-up in this clinic or with your primary care physician if symptoms fail to improve. Follow-up as needed.

## 2024-02-08 NOTE — ED Provider Notes (Signed)
 [facility]    CSN: [id number] Arrival date & time: 02/08/24  1442      History   Chief Complaint Chief Complaint  Patient presents with   Rash    HPI Tiffany Clark is a 43 y.o. female.   The history is provided by the patient.   Patient presents for complaints of rash to her right upper extremity has been present for the past several days.  Patient states she has noticed sporadic rash over her body and including her face.  Patient states symptoms started after she was out working in her yard.  She states that the area is itchy.  She states that she has not had exposures to new soaps, medications, lotions, foods, or detergents.  States she has been alternating calamine lotion and hydrocortisone cream to the affected areas.  She states she became concerned when she noticed increased redness to her right upper arm.  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 and of unspecified site    Intrauterine normal pregnancy 04/29/2014   Missed ab    x 3 - no surgery required    Other abnormalities in shape or position of gravid uterus and of neighboring structures, antepartum    prolapse during pregnancy   SVD (spontaneous vaginal delivery)    x 3   Threatened abortion in first trimester 07/24/2013   Urinary retention 10/03/2011    Patient Active Problem List   Diagnosis Date Noted   Vitamin B12 deficiency 08/01/2023   Venous thromboembolism 07/11/2023   Fatigue 07/11/2023   Pelvic pain 03/12/2023   Chronic pelvic pain in female 03/12/2023   Vitamin D  deficiency 07/19/2022   Prediabetes 07/19/2022   Chest pain 07/19/2022   GAD  (generalized anxiety disorder) 12/31/2021   Encounter for general adult medical examination with abnormal findings 07/13/2021   Gastroesophageal reflux disease 07/13/2020   Pain in female genitalia on intercourse 07/13/2020   Stress incontinence (female) (female) 07/13/2020   Uterine prolapse 07/13/2020   Mixed hyperlipidemia 07/13/2020   History of nephrolithiasis 01/11/2020   Obesity (BMI 30-39.9) 01/11/2020   Gestational diabetes mellitus in childbirth, diet controlled     Past Surgical History:  Procedure Laterality Date   CARPAL TUNNEL RELEASE     Right  WRIST ONLY   CESAREAN SECTION N/A 04/29/2014   Procedure: CESAREAN SECTION;  Surgeon: Delon CHRISTELLA Prude, DO;  Location: [facility];  Service: Obstetrics;  Laterality: N/A;   CESAREAN SECTION N/A    Phreesia 01/10/2020   LITHOTRIPSY     x 1   ROBOTIC ASSISTED TOTAL HYSTERECTOMY WITH BILATERAL SALPINGO OOPHERECTOMY Bilateral 03/12/2023   Procedure: XI ROBOTIC ASSISTED TOTAL HYSTERECTOMY WITH BILATERAL SALPINGO;  Surgeon: Storm Setter, DO;  Location: [facility];  Service: Gynecology;  Laterality: Bilateral;   WISDOM TOOTH EXTRACTION      OB History     Gravida  7   Para  4   Term  4   Preterm      AB  3   Living  4  SAB  3   IAB      Ectopic      Multiple  0   Live Births  4            Home Medications    Prior to Admission medications   Medication Sig Start Date End Date Taking? Authorizing Provider  Cholecalciferol (VITAMIN D3) 50 MCG (2000 UT) capsule Take 2,000 Units by mouth daily.    [provider]  ibuprofen  (ADVIL ) 800 MG tablet Take 1 tablet (800 mg total) by mouth every 8 (eight) hours as needed. 03/12/23   Storm Setter, DO  phentermine  (ADIPEX-P ) 37.5 MG tablet Take 1 tablet (37.5 mg total) by mouth daily before breakfast. 11/25/23   Tobie Suzzane POUR, MD    Family History Family History  Adopted: Yes  Problem Relation Age of Onset   Other Neg Hx     Social History Social  History   Tobacco Use   Smoking status: Former    Current packs/day: 0.00    Average packs/day: 0.8 packs/day for 6.0 years (4.5 ttl pk-yrs)    Types: Cigarettes    Start date: 10/31/1998    Quit date: 10/30/2004    Years since quitting: 19.2   Smokeless tobacco: Never  Vaping Use   Vaping status: Never Used  Substance Use Topics   Alcohol use: No   Drug use: No     Allergies   Aleve [naproxen sodium], Hydrocodone, Naproxen, and Latex   Review of Systems Review of Systems Per HPI  Physical Exam Triage Vital Signs ED Triage Vitals  Encounter Vitals Group     BP 02/08/24 1455 117/80     Girls Systolic BP Percentile --      Girls Diastolic BP Percentile --      Boys Systolic BP Percentile --      Boys Diastolic BP Percentile --      Pulse Rate 02/08/24 1455 99     Resp 02/08/24 1455 16     Temp 02/08/24 1455 98.6 F (37 C)     Temp Source 02/08/24 1455 Oral     SpO2 02/08/24 1455 96 %     Weight --      Height --      Head Circumference --      Peak Flow --      Pain Score 02/08/24 1452 0     Pain Loc --      Pain Education --      Exclude from Growth Chart --    No data found.  Updated Vital Signs BP 117/80 (BP Location: Right Arm)   Pulse 99   Temp 98.6 F (37 C) (Oral)   Resp 16   LMP 06/03/2013   SpO2 96%   Breastfeeding No   Visual Acuity Right Eye Distance:   Left Eye Distance:   Bilateral Distance:    Right Eye Near:   Left Eye Near:    Bilateral Near:     Physical Exam Vitals and nursing note reviewed.  Constitutional:      General: She is not in acute distress.    Appearance: Normal appearance.  HENT:     Head: Normocephalic.  Eyes:     Extraocular Movements: Extraocular movements intact.     Pupils: Pupils are equal, round, and reactive to light.  Cardiovascular:     Rate and Rhythm: Normal rate and regular rhythm.     Pulses: Normal pulses.     Heart sounds: Normal  heart sounds.  Pulmonary:     Effort: Pulmonary effort is  normal.     Breath sounds: Normal breath sounds.  Musculoskeletal:     Cervical back: Normal range of motion.  Skin:    General: Skin is warm and dry.     Findings: Rash present. Rash is macular and papular.     Comments: Large maculopapular rash noted to the right upper extremity.  Moderate erythema is present.  There is no oozing, fluctuance, or drainage present.  Diffuse areas of the rash noted to the patient's right abdomen, forehead, and left upper extremity.  Neurological:     General: No focal deficit present.     Mental Status: She is alert and oriented to person, place, and time.  Psychiatric:        Mood and Affect: Mood normal.        Behavior: Behavior normal.      UC Treatments / Results  Labs (all labs ordered are listed, but only abnormal results are displayed) Labs Reviewed - No data to display  EKG   Radiology No results found.  Procedures Procedures (including critical care time)  Medications Ordered in UC Medications  dexamethasone  (DECADRON ) injection 10 mg (has no administration in time range)    Initial Impression / Assessment and Plan / UC Course  I have reviewed the triage vital signs and the nursing notes.  Pertinent labs & imaging results that were available during my care of the patient were reviewed by me and considered in my medical decision making (see chart for details).  Symptoms consistent with poison ivy dermatitis.  Treat with Decadron  10 mg.  Will start patient on prednisone 40 mg along with triamcinolone cream 0.1%.  Supportive care recommendations were provided discussed with the patient to include over-the-counter antihistamines, cool compresses to the affected areas, and use of Aveeno colloidal oatmeal bath to help with itching and drying.  Discussed indications with the patient regarding follow-up.  Patient was in agreement with this plan of care and verbalizes understanding.  All questions were answered.  Patient stable for  discharge.  Final Clinical Impressions(s) / UC Diagnoses   Final diagnoses:  None   Discharge Instructions   None    ED Prescriptions   None    PDMP not reviewed this encounter.   Gilmer Etta PARAS, NP 02/08/24 1506

## 2024-02-08 NOTE — ED Triage Notes (Signed)
 Pt states rash to right upper arm for the past 3 days. Red rash noted warm to touch.  States she has been using calamine lotion and hydrocortisone cream.

## 2024-02-25 ENCOUNTER — Ambulatory Visit (INDEPENDENT_AMBULATORY_CARE_PROVIDER_SITE_OTHER): Admitting: Internal Medicine

## 2024-02-25 ENCOUNTER — Encounter: Payer: Self-pay | Admitting: Internal Medicine

## 2024-02-25 VITALS — BP 125/85 | HR 88 | Ht 63.0 in | Wt 175.2 lb

## 2024-02-25 DIAGNOSIS — F411 Generalized anxiety disorder: Secondary | ICD-10-CM | POA: Diagnosis not present

## 2024-02-25 DIAGNOSIS — E782 Mixed hyperlipidemia: Secondary | ICD-10-CM

## 2024-02-25 DIAGNOSIS — E669 Obesity, unspecified: Secondary | ICD-10-CM

## 2024-02-25 DIAGNOSIS — R7303 Prediabetes: Secondary | ICD-10-CM

## 2024-02-25 NOTE — Assessment & Plan Note (Signed)
 Well-controlled now Used to take Prozac  20 mg QD in the past, was tapered and discontinued later

## 2024-02-25 NOTE — Assessment & Plan Note (Signed)
DASH diet advised Check lipid profile 

## 2024-02-25 NOTE — Assessment & Plan Note (Signed)
 BMI Readings from Last 3 Encounters:  02/25/24 31.04 kg/m  11/25/23 35.92 kg/m  08/01/23 34.97 kg/m   Has had excellent response with Phentermine , lost 27 lbs since the last visit Advised to follow low-carb diet and perform moderate exercise/walking at least 150 minutes/week Had discussed different medical weight loss options, including their benefits and side effects - she would benefit from [facility] or Zepbound, but cost is a concern Continue phentermine  37.5 mg once daily for now

## 2024-02-25 NOTE — Progress Notes (Signed)
 Established Patient Office Visit  Subjective:  Patient ID: Tiffany Clark, female    DOB: 30-Jul-1980  Age: 43 y.o. MRN: [id number]  CC:  Chief Complaint  Patient presents with   Follow-up    Follow up    HPI Tiffany Clark is a 43 y.o. female with past medical history of MDD, nephrolithiasis and gestational diabetes who presents for f/u of her chronic medical conditions.  Obesity: She has lost 27 lbs with phentermine  since the last visit.  She has tolerated phentermine  well overall.  Denies any episode of palpitations or tremors.  She had been struggling to lose weight despite improving her diet before starting phentermine .  She has tried weight watchers and has been following high-protein diet for the last 1 year.  She is motivated to continue to follow low-carb diet and physical activity for now.   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 and of unspecified site    Intrauterine normal pregnancy 04/29/2014   Missed ab    x 3 - no surgery required    Other abnormalities in shape or position of gravid uterus and of neighboring structures, antepartum    prolapse during pregnancy   SVD (spontaneous vaginal delivery)    x 3   Threatened abortion in first trimester 07/24/2013   Urinary retention 10/03/2011    Past Surgical History:  Procedure Laterality Date   CARPAL TUNNEL RELEASE     Right  WRIST ONLY   CESAREAN SECTION N/A 04/29/2014   Procedure: CESAREAN SECTION;  Surgeon: Delon CHRISTELLA Prude, DO;  Location: [facility];  Service: Obstetrics;  Laterality: N/A;   CESAREAN SECTION N/A    Phreesia 01/10/2020   LITHOTRIPSY     x 1   ROBOTIC  ASSISTED TOTAL HYSTERECTOMY WITH BILATERAL SALPINGO OOPHERECTOMY Bilateral 03/12/2023   Procedure: XI ROBOTIC ASSISTED TOTAL HYSTERECTOMY WITH BILATERAL SALPINGO;  Surgeon: Storm Setter, DO;  Location: [facility];  Service: Gynecology;  Laterality: Bilateral;   WISDOM TOOTH EXTRACTION      Family History  Adopted: Yes  Problem Relation Age of Onset   Other Neg Hx     Social History   Socioeconomic History   Marital status: Married    Spouse name: Not on file   Number of children: 4   Years of education: Not on file   Highest education level: Bachelor's degree (e.g., BA, AB, BS)  Occupational History    Employer: [employer]    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  Tobacco Use   Smoking status: Former    Current packs/day: 0.00    Average packs/day: 0.8 packs/day for 6.0 years (4.5 ttl pk-yrs)    Types: Cigarettes    Start date: 10/31/1998    Quit date: 10/30/2004    Years since quitting: 19.3   Smokeless tobacco: Never  Vaping Use   Vaping status: Never Used  Substance and Sexual Activity   Alcohol use: No   Drug use: No   Sexual activity: Yes    Birth control/protection: None    Comment: pregnant  Other Topics  Concern   Not on file  Social History Narrative   Not on file   Social Drivers of Health   Financial Resource Strain: Low Risk  (11/24/2023)   Overall Financial Resource Strain (CARDIA)    Difficulty of Paying Living Expenses: Not hard at all  Food Insecurity: No Food Insecurity (11/24/2023)   Hunger Vital Sign    Worried About Running Out of Food in the Last Year: Never true    Ran Out of Food in the Last Year: Never true  Transportation Needs: No Transportation Needs (11/24/2023)   PRAPARE - Administrator, Civil Service (Medical): No    Lack of Transportation (Non-Medical): No  Physical Activity: Insufficiently Active (11/24/2023)   Exercise Vital Sign    Days of Exercise per Week: 3 days    Minutes of Exercise per Session: 30 min  Stress: Stress Concern Present  (11/24/2023)   [employer] of Occupational Health - Occupational Stress Questionnaire    Feeling of Stress: To some extent  Social Connections: Moderately Integrated (11/24/2023)   Social Connection and Isolation Panel    Frequency of Communication with Friends and Family: More than three times a week    Frequency of Social Gatherings with Friends and Family: Once a week    Attends Religious Services: More than 4 times per year    Active Member of [employer] or Organizations: No    Attends Banker Meetings: Not on file    Marital Status: Married  Catering Manager Violence: Not At Risk (07/11/2023)   Humiliation, Afraid, Rape, and Kick questionnaire    Fear of Current or Ex-Partner: No    Emotionally Abused: No    Physically Abused: No    Sexually Abused: No    Outpatient Medications Prior to Visit  Medication Sig Dispense Refill   Cholecalciferol (VITAMIN D3) 50 MCG (2000 UT) capsule Take 2,000 Units by mouth daily.     ibuprofen  (ADVIL ) 800 MG tablet Take 1 tablet (800 mg total) by mouth every 8 (eight) hours as needed. 30 tablet 0   phentermine  (ADIPEX-P ) 37.5 MG tablet Take 1 tablet (37.5 mg total) by mouth daily before breakfast. 30 tablet 3   triamcinolone  cream (KENALOG ) 0.1 % Apply 1 Application topically 2 (two) times daily. 453.6 g 0   No facility-administered medications prior to visit.    Allergies  Allergen Reactions   Aleve [Naproxen Sodium]     Heart races and sweaty palms. Feels like panic attack. Can take Ibuprofen    Hydrocodone Nausea And Vomiting   Naproxen     Other reaction(s): Unknown   Latex Rash    Other reaction(s): Unknown    ROS Review of Systems  Constitutional:  Negative for chills and fever.  HENT:  Negative for congestion, sinus pressure, sinus pain and sore throat.   Eyes:  Negative for pain and discharge.  Respiratory:  Negative for cough and shortness of breath.   Cardiovascular:  Negative for chest pain and palpitations.   Gastrointestinal:  Negative for abdominal pain, diarrhea, nausea and vomiting.  Endocrine: Negative for polydipsia and polyuria.  Genitourinary:  Negative for dysuria and hematuria.  Musculoskeletal:  Negative for neck pain and neck stiffness.  Skin:  Negative for rash.  Neurological:  Negative for dizziness and weakness.  Psychiatric/Behavioral:  Negative for agitation and behavioral problems.       Objective:    Physical Exam Vitals reviewed.  Constitutional:      General: She is not in acute  distress.    Appearance: She is obese. She is not diaphoretic.  HENT:     Head: Normocephalic and atraumatic.  Eyes:     General: No scleral icterus.    Extraocular Movements: Extraocular movements intact.  Cardiovascular:     Rate and Rhythm: Normal rate and regular rhythm.     Heart sounds: Normal heart sounds. No murmur heard. Pulmonary:     Breath sounds: Normal breath sounds. No wheezing or rales.  Musculoskeletal:     Cervical back: Neck supple. No tenderness.     Right lower leg: No edema.     Left lower leg: No edema.  Skin:    General: Skin is warm.     Findings: No rash.  Neurological:     General: No focal deficit present.     Mental Status: She is alert and oriented to person, place, and time.     Sensory: No sensory deficit.     Motor: No weakness.  Psychiatric:        Mood and Affect: Mood normal.        Behavior: Behavior normal.     BP 125/85   Pulse 88   Ht 5' 3 (1.6 m)   Wt 175 lb 3.2 oz (79.5 kg)   LMP 06/03/2013   SpO2 93%   BMI 31.04 kg/m  Wt Readings from Last 3 Encounters:  02/25/24 175 lb 3.2 oz (79.5 kg)  11/25/23 202 lb 12.8 oz (92 kg)  08/01/23 197 lb 6.4 oz (89.5 kg)    Lab Results  Component Value Date   TSH 2.270 11/25/2023   Lab Results  Component Value Date   WBC 9.8 11/25/2023   HGB 13.7 11/25/2023   HCT 41.6 11/25/2023   MCV 91 11/25/2023   PLT 422 11/25/2023   Lab Results  Component Value Date   NA 137 11/25/2023    K 4.1 11/25/2023   CO2 21 11/25/2023   GLUCOSE 94 11/25/2023   BUN 17 11/25/2023   CREATININE 0.77 11/25/2023   BILITOT 0.2 11/25/2023   ALKPHOS 89 11/25/2023   AST 22 11/25/2023   ALT 27 11/25/2023   PROT 7.6 11/25/2023   ALBUMIN 4.4 11/25/2023   CALCIUM 9.9 11/25/2023   ANIONGAP 10 07/11/2023   EGFR 98 11/25/2023   Lab Results  Component Value Date   CHOL 197 11/25/2023   Lab Results  Component Value Date   HDL 41 11/25/2023   Lab Results  Component Value Date   LDLCALC 123 (H) 11/25/2023   Lab Results  Component Value Date   TRIG 189 (H) 11/25/2023   Lab Results  Component Value Date   CHOLHDL 4.8 (H) 11/25/2023   Lab Results  Component Value Date   HGBA1C 5.8 (H) 11/25/2023      Assessment & Plan:   Problem List Items Addressed This Visit       Other   Obesity (BMI 30-39.9) - Primary   BMI Readings from Last 3 Encounters:  02/25/24 31.04 kg/m  11/25/23 35.92 kg/m  08/01/23 34.97 kg/m   Has had excellent response with Phentermine , lost 27 lbs since the last visit Advised to follow low-carb diet and perform moderate exercise/walking at least 150 minutes/week Had discussed different medical weight loss options, including their benefits and side effects - she would benefit from [facility] or Zepbound, but cost is a concern Continue phentermine  37.5 mg once daily for now      Mixed hyperlipidemia   DASH diet advised Check  lipid profile      Relevant Orders   Lipid Profile   GAD (generalized anxiety disorder)   Well-controlled now Used to take Prozac  20 mg QD in the past, was tapered and discontinued later      Prediabetes   Lab Results  Component Value Date   HGBA1C 5.8 (H) 11/25/2023   Advised to follow low carb diet for now      Relevant Orders   CMP14+EGFR   Hemoglobin A1c     No orders of the defined types were placed in this encounter.   Follow-up: Return in about 4 months (around 06/24/2024).    Suzzane MARLA Blanch, MD

## 2024-02-25 NOTE — Patient Instructions (Addendum)
 Please continue to take medications as prescribed.  Please continue to follow low carb diet and perform moderate exercise/walking at least 150 mins/week.  Please get fasting blood tests done before the next visit.

## 2024-02-25 NOTE — Assessment & Plan Note (Signed)
 Lab Results  Component Value Date   HGBA1C 5.8 (H) 11/25/2023   Advised to follow low carb diet for now

## 2024-02-27 ENCOUNTER — Encounter: Payer: Self-pay | Admitting: Internal Medicine

## 2024-03-01 ENCOUNTER — Other Ambulatory Visit: Payer: Self-pay | Admitting: Internal Medicine

## 2024-03-01 ENCOUNTER — Other Ambulatory Visit (HOSPITAL_COMMUNITY): Payer: Self-pay

## 2024-03-01 DIAGNOSIS — E669 Obesity, unspecified: Secondary | ICD-10-CM

## 2024-03-01 MED ORDER — PHENTERMINE HCL 37.5 MG PO TABS
37.5000 mg | ORAL_TABLET | Freq: Every day | ORAL | 3 refills | Status: AC
  Filled 2024-03-01 – 2024-03-26 (×3): qty 30, 30d supply, fill #0
  Filled 2024-04-28 – 2024-04-30 (×2): qty 30, 30d supply, fill #1

## 2024-03-23 ENCOUNTER — Other Ambulatory Visit (HOSPITAL_COMMUNITY): Payer: Self-pay

## 2024-03-26 ENCOUNTER — Other Ambulatory Visit (HOSPITAL_COMMUNITY): Payer: Self-pay

## 2024-03-31 ENCOUNTER — Other Ambulatory Visit (HOSPITAL_COMMUNITY): Payer: Self-pay

## 2024-04-29 ENCOUNTER — Other Ambulatory Visit (HOSPITAL_COMMUNITY): Payer: Self-pay

## 2024-04-29 ENCOUNTER — Other Ambulatory Visit: Payer: Self-pay

## 2024-06-22 ENCOUNTER — Ambulatory Visit: Admitting: Internal Medicine
